# Patient Record
Sex: Male | Born: 1965 | Race: White | Hispanic: No | Marital: Married | Smoking: Former smoker
Health system: Southern US, Community
[De-identification: ages and names within clinical notes are randomized; demographics above are authoritative.]

## PROBLEM LIST (undated history)

## (undated) DIAGNOSIS — E785 Hyperlipidemia, unspecified: Secondary | ICD-10-CM

## (undated) DIAGNOSIS — IMO0001 Reserved for inherently not codable concepts without codable children: Secondary | ICD-10-CM

## (undated) DIAGNOSIS — I2510 Atherosclerotic heart disease of native coronary artery without angina pectoris: Secondary | ICD-10-CM

## (undated) DIAGNOSIS — R030 Elevated blood-pressure reading, without diagnosis of hypertension: Secondary | ICD-10-CM

## (undated) DIAGNOSIS — I10 Essential (primary) hypertension: Secondary | ICD-10-CM

## (undated) HISTORY — DX: Hyperlipidemia, unspecified: E78.5

## (undated) HISTORY — DX: Reserved for inherently not codable concepts without codable children: IMO0001

## (undated) HISTORY — DX: Elevated blood-pressure reading, without diagnosis of hypertension: R03.0

---

## 2010-04-06 HISTORY — PX: CORONARY STENT PLACEMENT: SHX1402

## 2010-05-14 ENCOUNTER — Encounter: Payer: Self-pay | Admitting: Cardiology

## 2010-05-21 ENCOUNTER — Telehealth: Payer: Self-pay | Admitting: Cardiology

## 2010-05-23 ENCOUNTER — Encounter: Payer: Self-pay | Admitting: Cardiology

## 2010-05-23 ENCOUNTER — Other Ambulatory Visit: Payer: Self-pay | Admitting: Cardiology

## 2010-05-23 ENCOUNTER — Ambulatory Visit (INDEPENDENT_AMBULATORY_CARE_PROVIDER_SITE_OTHER): Payer: Self-pay | Admitting: Cardiology

## 2010-05-23 DIAGNOSIS — E785 Hyperlipidemia, unspecified: Secondary | ICD-10-CM

## 2010-05-23 DIAGNOSIS — I10 Essential (primary) hypertension: Secondary | ICD-10-CM

## 2010-05-23 DIAGNOSIS — R079 Chest pain, unspecified: Secondary | ICD-10-CM

## 2010-05-23 LAB — BASIC METABOLIC PANEL
BUN: 16 mg/dL (ref 6–23)
GFR: 75.98 mL/min (ref >60.00)
Glucose, Bld: 82 mg/dL (ref 70–99)
Potassium: 4.1 mEq/L (ref 3.5–5.1)

## 2010-05-23 LAB — HEPATIC FUNCTION PANEL
AST: 24 U/L (ref 0–37)
Albumin: 4.4 g/dL (ref 3.5–5.2)

## 2010-05-23 LAB — LIPID PANEL
Cholesterol: 230 mg/dL — ABNORMAL HIGH (ref 0–200)
VLDL: 36.4 mg/dL (ref 0.0–40.0)

## 2010-05-26 ENCOUNTER — Ambulatory Visit: Payer: Self-pay | Admitting: Cardiovascular Disease

## 2010-05-28 NOTE — Assessment & Plan Note (Signed)
Summary: [facility]   Visit Type:  Initial Consult Primary Provider:  [facility]  CC:  chest pressure, sob, tightness, duration of the discomfort varies. left leg numbness, and cramping in buttocks while walking. [facility] very bad headaches..  History of Present Illness: Todd Kennedy comes with CC of CP from [facility]  It started about a year ago. It is over left breast and does not radiate. It occurs spontaneosly and not related to exertion. He tried PPI for several mos without relief.  He has multiple CRFs including age, 37 PY smoking, borderline HTN, and high cholesterol. Does not remember numbers.  Current Medications (verified): 1)  Multivitamins  Caps (Multiple Vitamin)  Allergies (verified): No Known Drug Allergies  Review of Systems       negative other than HPI  Vital Signs:  Patient profile:   45 year old male Height:      66 inches Weight:      161.75 pounds BMI:     26.20 Pulse rate:   58 / minute Resp:     18 per minute BP sitting:   130 / 84  (left arm) Cuff size:   large  Vitals Entered By: Celestia Khat, CMA (May 23, 2010 11:15 AM)  Physical Exam  General:  Well developed, well nourished, in no acute distress. Head:  normocephalic and atraumatic Eyes:  PERRLA/EOM intact; conjunctiva and lids normal. Neck:  Neck supple, no JVD. No masses, thyromegaly or abnormal cervical nodes. Chest Savana Spina:  no deformities or breast masses noted Lungs:  Clear bilaterally to auscultation and percussion. Heart:  PMI normal, NLS1 AND S2, NO MURMUR, NO CAROTID BRUITS Abdomen:  NL, NO BRUIT Msk:  Back normal, normal gait. Muscle strength and tone normal. Pulses:  pulses normal in all 4 extremities Extremities:  No clubbing or cyanosis. Neurologic:  Alert and oriented x 3. Skin:  Intact without lesions or rashes. Psych:  Normal affect.   EKG  Procedure date:  05/23/2010  Findings:      SINUS BRADY, NL  EKG  Impression & Recommendations:  Problem # 1:  CHEST PAIN UNSPECIFIED (ICD-786.50) Assessment New THIS IS NONCARDIAC. HOWEVER HE HAS MULTIPLE CRFS AND MOST LIKELY HAS SOME PLAQUE. WILL ASK HIM TO MONITOR BP, NO SMOKING, WILL CHECK LIPIDS TODAY AND MAY NEED STATIN, AND BEGIN ECASA 81MG  A DAY. COUNSELED ON SXS OF CAD AND HOW TO RESPOND APPROPRIATELY WITH HE AND WIFE. His updated medication list for this problem includes:    Aspirin 81 Mg Tbec (Aspirin) .Marland Kitchen... Take one tablet by mouth daily  Orders: EKG w/ Interpretation (93000) TLB-BMP (Basic Metabolic Panel-BMET) (80048-METABOL) TLB-Hepatic/Liver Function Pnl (80076-HEPATIC) TLB-Lipid Panel (80061-LIPID)  Patient Instructions: 1)  Your physician recommends that you schedule a follow-up appointment in: as needed with Dr. Daleen Squibb 2)  Your physician recommends that you have a FASTING lipid profile and cmp today.  3)  Your physician has recommended you make the following change in your medication: START  Aspirin 81mg  daily 4)  Your physician recommends a low cholesterol, low fat diet. Please see [facility] handout. 5)  Your physician has requested that you regularly monitor and record your blood pressure readings at home.  Please use the same machine at the same time of day to check your readings and record them to bring to your follow-up visit. GOAL BP less than 130/80 6)  Your physician discussed the importance of regular exercise and recommended that you start or  continue a regular exercise program for good health.

## 2010-05-28 NOTE — Progress Notes (Signed)
Summary: wants to be seen sooner  Phone Note Call from Patient Call back at Home Phone [phone]   Caller: Patient Reason for Call: Talk to Nurse Summary of Call: pt states he having more chest and sob more often. pt wants to know if he can be seen sooner.  Initial call taken by: Roe Coombs,  May 21, 2010 10:10 AM  Follow-up for Phone Call        spoke with pt, he is having increased symptoms of chest discomfort and after seeing the ortho was told he needed to see cardiology. appt moved to friday with dr wall. pt aware Deliah Goody, RN  May 21, 2010 10:58 AM

## 2010-05-29 ENCOUNTER — Telehealth: Payer: Self-pay | Admitting: Cardiology

## 2010-05-30 ENCOUNTER — Ambulatory Visit (INDEPENDENT_AMBULATORY_CARE_PROVIDER_SITE_OTHER): Payer: PRIVATE HEALTH INSURANCE | Admitting: Physician Assistant

## 2010-05-30 ENCOUNTER — Other Ambulatory Visit: Payer: Self-pay | Admitting: Physician Assistant

## 2010-05-30 ENCOUNTER — Encounter: Payer: Self-pay | Admitting: Physician Assistant

## 2010-05-30 DIAGNOSIS — R030 Elevated blood-pressure reading, without diagnosis of hypertension: Secondary | ICD-10-CM

## 2010-05-30 DIAGNOSIS — R002 Palpitations: Secondary | ICD-10-CM

## 2010-05-30 DIAGNOSIS — E785 Hyperlipidemia, unspecified: Secondary | ICD-10-CM | POA: Insufficient documentation

## 2010-05-30 LAB — URINALYSIS
Hgb urine dipstick: NEGATIVE
Specific Gravity, Urine: >=1.03 (ref 1.000–1.030)
Total Protein, Urine: NEGATIVE
Urine Glucose: NEGATIVE

## 2010-05-30 NOTE — Assessment & Plan Note (Signed)
He will pick up his prescription for Lipitor soon and start his medication.

## 2010-05-30 NOTE — Assessment & Plan Note (Signed)
Overall, I suspect he has pre-hypertension.  I have had a discussion with him today regarding therapeutic lifestyle modifications.  We will give him a handout regarding low salt diet.  Also recommend increase activity with exercise.  He has had some fluctuations in his blood pressure with associated flushing and palpitations.  Although I think the likelihood is very low, I will have him do a 24-hour urine for rule out of pheochromocytoma.  He had a normal creatinine and blood work recently.  We'll check a urinalysis to screen for protein.  His exam is negative for abdominal bruits.  He can follow up in 3 months routinely.  He will keep track of his blood pressures at home.  He knows to contact us sooner if his blood pressures remain elevated.

## 2010-05-30 NOTE — Progress Notes (Signed)
History of Present Illness: The patient is a 45 year old male recently seen by Dr. Daleen Squibb for chest pain.  This is felt to be noncardiac.  Lab work did demonstrate hyperlipidemia.  He was placed on atorvastatin.  He called in yesterday with concerns over his blood pressure.  His blood pressure was as high as 150/100.  He did feel flushed with this.  He's also noted some palpitations and diaphoresis in the past with elevated blood pressures.  This morning his blood pressure is normal.  He did have a high salt meal prior to his elevated blood pressure the other day.  He had coffee this morning.  We checked his wrist cuff with our manual blood pressure cuff and got similar readings.  When I repeated his blood pressure manually, his right arm was 120/80 and his left arm is 120/80.  He denies chest pain, shortness of breath, syncope.  He has had some headaches.  He denies difficulty with speech or unilateral weakness.  Of note, he has been on blood pressure medicines in the past.  He is adopted and does not know his family history.  He is an Corporate investment banker.  He recently stopped exertional activities until he saw Dr. Daleen Squibb last week.  Past Medical History  Diagnosis Date  . Hyperlipidemia     Statin therapy initiated 2012  . Elevated blood pressure     Previously took antihypertensives    Current Medications: Current outpatient prescriptions:aspirin 81 MG tablet, Take 81 mg by mouth daily.  , Disp: , Rfl: ;  Multiple Vitamin (MULTIVITAMIN) capsule, Take 1 capsule by mouth daily.  , Disp: , Rfl: ;  atorvastatin (LIPITOR) 10 MG tablet, Take 10 mg by mouth daily.  , Disp: , Rfl:   No Known Allergies  ROS:  See HPI.  All other systems reviewed and negative.  Vital Signs: BP 130/80  Pulse 90  Ht 5\' 6"  (1.676 m)  Wt 164 lb 8 oz (74.617 kg)  BMI 26.55 kg/m2  PHYSICAL EXAM: Well nourished, well developed, in no acute distress HEENT: normal Neck: no JVD Vasc: no carotid bruits Endo: no thyromegaly Cardiac:   normal S1, S2; RRR; no murmur Lungs:  clear to auscultation bilaterally, no wheezing, rhonchi or rales Abd: soft, nontender, no hepatomegaly Ext: no edema Skin: warm and dry Neuro:  CNs 2-12 intact, no focal abnormalities noted

## 2010-06-02 ENCOUNTER — Encounter: Payer: Self-pay | Admitting: Cardiology

## 2010-06-02 ENCOUNTER — Other Ambulatory Visit (INDEPENDENT_AMBULATORY_CARE_PROVIDER_SITE_OTHER): Payer: PRIVATE HEALTH INSURANCE

## 2010-06-02 DIAGNOSIS — R030 Elevated blood-pressure reading, without diagnosis of hypertension: Secondary | ICD-10-CM

## 2010-06-03 ENCOUNTER — Ambulatory Visit: Payer: Self-pay | Admitting: Cardiology

## 2010-06-03 ENCOUNTER — Other Ambulatory Visit: Payer: PRIVATE HEALTH INSURANCE

## 2010-06-03 ENCOUNTER — Encounter: Payer: Self-pay | Admitting: Cardiology

## 2010-06-03 NOTE — Miscellaneous (Signed)
Summary: Orders Update  Clinical Lists Changes  Orders: Added new Test order of TLB-Udip ONLY (81003-UDIP) - Signed 

## 2010-06-03 NOTE — Progress Notes (Signed)
Summary: blood pressure  Phone Note Call from Patient Call back at Home Phone [phone]   Reason for Call: Talk to Nurse Summary of Call: pt states his blood pressure today  153/107 147/102. pt states it been high for a couples of days. pt wants to know if he can get some meds. Initial call taken by: Roe Coombs,  May 29, 2010 12:02 PM  Follow-up for Phone Call        [facility] Follow-up by: Lisabeth Devoid RN,  May 29, 2010 2:03 PM  Additional Follow-up for Phone Call Additional follow up Details #1::        Pt calls with c/o blood pressure running high 05/28/10 153/107.  this was taken with his new bp cuff.  He has not been watching his sodium.  He felt lightheaded and a " little dizzy and weird" and flushed yesterday.   He also felt this way after eating tomato soup and crackers. He checked his bp and it was 150/102. He will see Tereso Newcomer PA 2/24 at 8:30 am regarding bp. Additional Follow-up by: Lisabeth Devoid RN,  May 29, 2010 2:36 PM     Appended Document: blood pressure    Clinical Lists Changes  Medications: Added new medication of ATORVASTATIN CALCIUM 10 MG TABS (ATORVASTATIN CALCIUM) 1 tablet at bedtime - Signed Rx of ATORVASTATIN CALCIUM 10 MG TABS (ATORVASTATIN CALCIUM) 1 tablet at bedtime;  #30 x 11;  Signed;  Entered by: Lisabeth Devoid RN;  Authorized by: Gaylord Shih, MD, [facility];  Method used: [facility]  [place] [address], [place], [place]  [address], Ph: [phone] or [phone], Fax: [phone]    Prescriptions: ATORVASTATIN CALCIUM 10 MG TABS (ATORVASTATIN CALCIUM) 1 tablet at bedtime  #30 x 11   Entered by:   Lisabeth Devoid RN   Authorized by:   Gaylord Shih, MD, [facility]   Signed by:   Lisabeth Devoid RN on 05/29/2010   Method used:   [facility]  [place] [address] [facility]       [address]       [place], [place]  [address]       Ph: [phone] or [phone]       Fax: [phone]   RxID:    [id number]    Appended Document: blood pressure  Reviewed Juanito Doom, MD

## 2010-06-03 NOTE — Assessment & Plan Note (Signed)
Summary: Please see noted done in [facility] that is scanned in.   Visit Type:  Follow-up Primary Provider:  [facility]  CC:  pt feels like blood pressure is high..  History of Present Illness:  Please see noted done in [facility] that is scanned in.  Current Medications (verified): 1)  Multivitamins  Caps (Multiple Vitamin) 2)  Aspirin 81 Mg Tbec (Aspirin) .... Take One Tablet By Mouth Daily 3)  Atorvastatin Calcium 10 Mg Tabs (Atorvastatin Calcium) .Marland Kitchen.. 1 Tablet At Bedtime  Allergies (verified): No Known Drug Allergies  Vital Signs:  Patient profile:   45 year old male Height:      66 inches Weight:      164.50 pounds BMI:     26.65 Pulse rate:   90 / minute Resp:     18 per minute BP sitting:   130 / 80  (left arm) Cuff size:   regular  Vitals Entered By: Celestia Khat, CMA (May 30, 2010 8:55 AM)   Other Orders: T-Culture, Urine ([id number])  Patient Instructions: 1)  Your physician wants you to follow-up in:   3 months with Tereso Newcomer, PA-C. You will receive a reminder letter in the mail two months in advance. If you don't receive a letter, please call our office to schedule the follow-up appointment. 2)  Your physician has requested that you limit the intake of sodium (salt) in your diet to four grams daily. Please see [facility] handout. 3)  Your physician recommends that you return for lab work in: Today for a Urinalysis for 796.2, 785.1.  4)  You will be given equipment to provide [place] with a 24 hour urine. 5)  Your physician has requested that you regularly monitor and record your blood pressure readings at home.  Please use the same machine at the same time of day to check your readings and record them to bring to your follow-up visit.  Appended Document: [facility]     Allergies: No Known Drug Allergies

## 2010-06-04 ENCOUNTER — Emergency Department (HOSPITAL_COMMUNITY)
Admission: EM | Admit: 2010-06-04 | Discharge: 2010-06-04 | Disposition: A | Discharge status: Home / Self Care | Payer: PRIVATE HEALTH INSURANCE | Attending: Emergency Medicine | Admitting: Emergency Medicine

## 2010-06-04 ENCOUNTER — Emergency Department (HOSPITAL_COMMUNITY): Payer: PRIVATE HEALTH INSURANCE

## 2010-06-04 ENCOUNTER — Telehealth: Payer: Self-pay | Admitting: Cardiology

## 2010-06-04 DIAGNOSIS — R079 Chest pain, unspecified: Secondary | ICD-10-CM | POA: Insufficient documentation

## 2010-06-04 DIAGNOSIS — R0602 Shortness of breath: Secondary | ICD-10-CM | POA: Insufficient documentation

## 2010-06-04 DIAGNOSIS — E789 Disorder of lipoprotein metabolism, unspecified: Secondary | ICD-10-CM | POA: Insufficient documentation

## 2010-06-04 DIAGNOSIS — Z79899 Other long term (current) drug therapy: Secondary | ICD-10-CM | POA: Insufficient documentation

## 2010-06-04 LAB — CBC
HCT: 45.2 % (ref 39.0–52.0)
MCH: 31.1 pg (ref 26.0–34.0)
MCHC: 36.3 g/dL — ABNORMAL HIGH (ref 30.0–36.0)
MCV: 85.6 fL (ref 78.0–100.0)
RDW: 12.4 % (ref 11.5–15.5)

## 2010-06-04 LAB — COMPREHENSIVE METABOLIC PANEL
ALT: 78 U/L — ABNORMAL HIGH (ref 0–53)
Alkaline Phosphatase: 49 U/L (ref 39–117)
CO2: 29 mEq/L (ref 19–32)
Chloride: 102 mEq/L (ref 96–112)
Glucose, Bld: 95 mg/dL (ref 70–99)
Potassium: 4 mEq/L (ref 3.5–5.1)
Sodium: 138 mEq/L (ref 135–145)
Total Protein: 7.1 g/dL (ref 6.0–8.3)

## 2010-06-04 LAB — POCT CARDIAC MARKERS: CKMB, poc: <1 ng/mL — ABNORMAL LOW (ref 1.0–8.0)

## 2010-06-10 ENCOUNTER — Encounter (INDEPENDENT_AMBULATORY_CARE_PROVIDER_SITE_OTHER): Payer: PRIVATE HEALTH INSURANCE | Admitting: Physician Assistant

## 2010-06-10 ENCOUNTER — Encounter: Payer: Self-pay | Admitting: Physician Assistant

## 2010-06-10 DIAGNOSIS — R079 Chest pain, unspecified: Secondary | ICD-10-CM

## 2010-06-12 NOTE — Progress Notes (Signed)
Summary: pt req call from nurse  Phone Note Call from Patient   Caller: Patient [phone] Reason for Call: Talk to Nurse, Talk to Doctor Summary of Call: pt would like call from nurse.Lauris Chroman had chest pain today and he wants to talk to you and Dr. Daleen Squibb about it. Initial call taken by: Glynda Jaeger,  June 04, 2010 1:55 PM  Follow-up for Phone Call        Spoke with pt who reports he had chest pain yesterday that happened several times and lasted around 20 seconds each time. Pain went away on own. Today has had constant pain in chest area. Pt states pain is worse when breathing in. He is able to work today but having a very stressful day due to it being the last day of the month.  He is wondering what the treatment plan is for his continuing chest pain.  Office note from visit with Dr. Daleen Squibb indicates non cardiac.  Will review with Tereso Newcomer, PA who saw pt last week.  Follow-up by: Dossie Arbour, RN, BSN,  June 04, 2010 4:40 PM  Additional Follow-up for Phone Call Additional follow up Details #1::        Discussed with Tereso Newcomer, PA who recommends treadmill if pain same as previous pain.  If pain increasing or different he should be seen in ED. Pt given this information and he would like to proceed with treadmill.  Instructions given for treadmill and pt transferred to schedulers to arrange appt. Additional Follow-up by: Dossie Arbour, RN, BSN,  June 04, 2010 5:00 PM

## 2010-06-17 NOTE — Letter (Signed)
Summary: Referral from [facility] @ [facility]  Referral from [place] @ [facility]   Imported By: Kassie Mends 06/11/2010 10:21:29  _____________________________________________________________________  External Attachment:    Type:   Image     Comment:   External Document

## 2010-06-17 NOTE — Progress Notes (Signed)
Summary: Office Visit  Office Visit   Imported By: Earl Many 06/10/2010 10:34:27  _____________________________________________________________________  External Attachment:    Type:   Image     Comment:   External Document

## 2010-06-18 LAB — CONVERTED CEMR LAB
Metaneph Total, Ur: 585 ug/24hr (ref 182–739)
Metanephrines, Ur: 172 (ref 58–203)
Normetanephrine, 24H Ur: 413 (ref 88–649)

## 2010-06-27 ENCOUNTER — Other Ambulatory Visit: Payer: PRIVATE HEALTH INSURANCE

## 2011-01-01 ENCOUNTER — Other Ambulatory Visit: Payer: Self-pay | Admitting: Family Medicine

## 2011-01-20 ENCOUNTER — Ambulatory Visit
Admission: RE | Admit: 2011-01-20 | Discharge: 2011-01-20 | Disposition: A | Discharge status: Home / Self Care | Payer: BC Managed Care – PPO | Source: Ambulatory Visit | Attending: Family Medicine | Admitting: Family Medicine

## 2012-02-22 IMAGING — CR DG CHEST 2V
2 series · 2 of 2 positions shown · non-contrast
Comparison: None.

CLINICAL DATA: Chest pain

CHEST - 2 VIEW

[w chest pa]
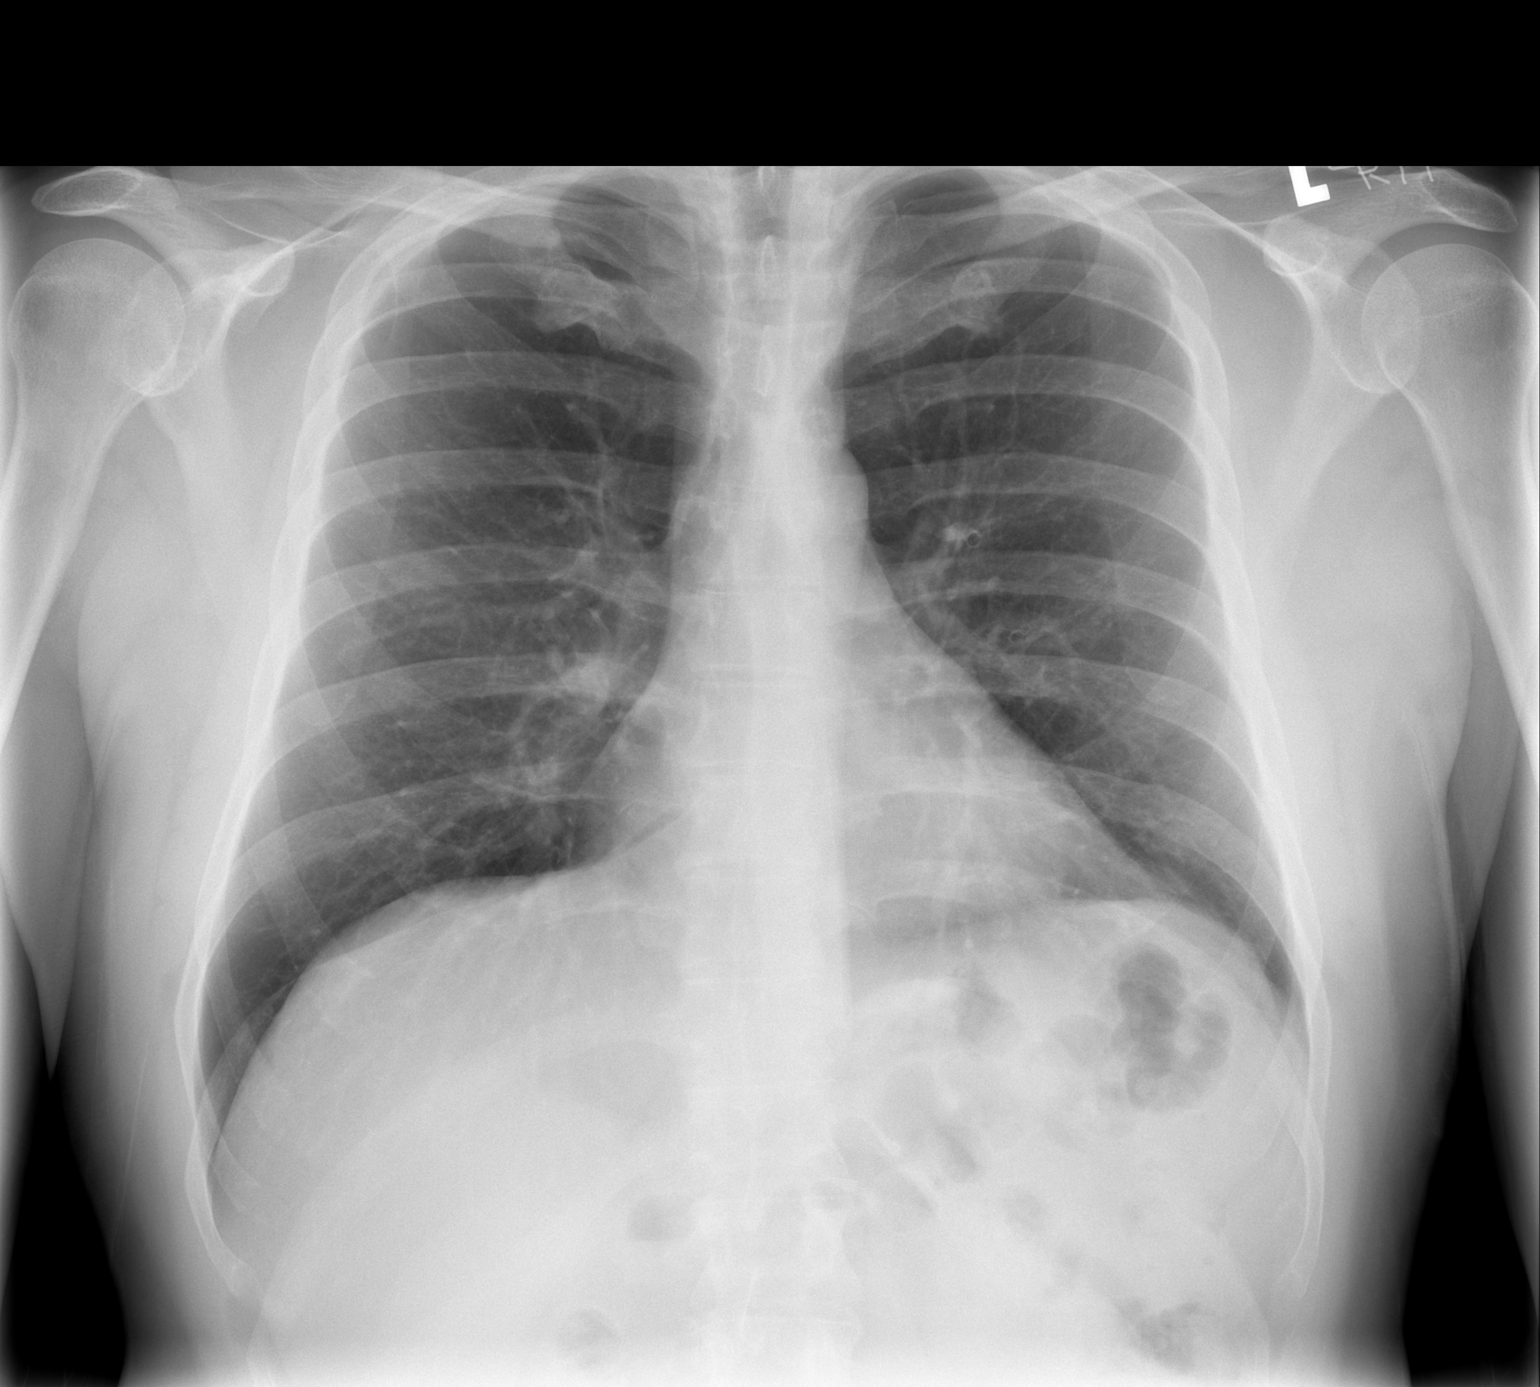

[w chest lat]
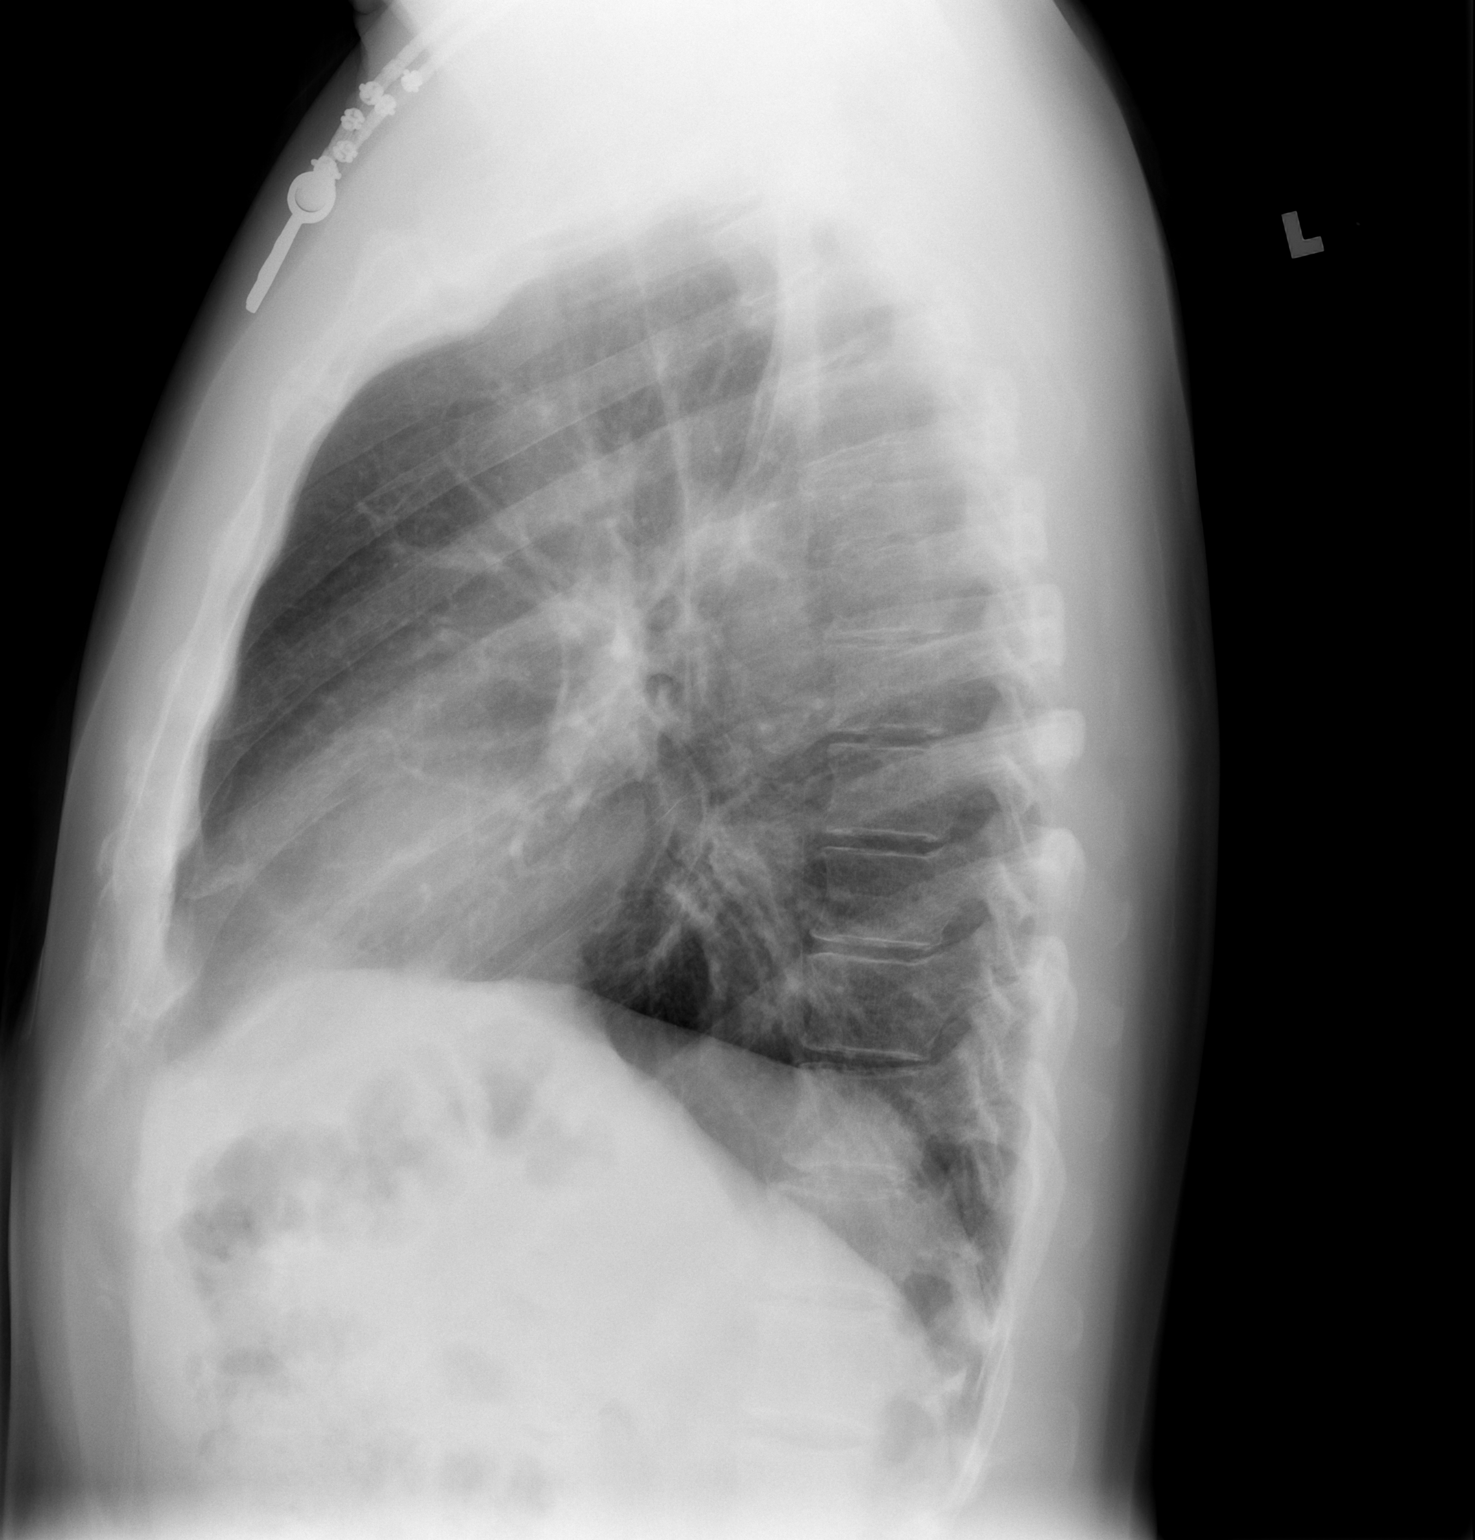

[2 of 2 positions shown; findings below may reference images not displayed]

FINDINGS: Heart is also is within normal limits.  Mild bronchitic
changes.  No pneumothorax and no pleural effusion.  No
consolidation or mass.
IMPRESSION: Bronchitic changes.

## 2012-10-09 IMAGING — RF DG ESOPHAGUS
14 of 17 series · 19 of 24 positions shown · non-contrast
Comparison: None.

CLINICAL DATA: Chest pain

ESOPHOGRAM/BARIUM SWALLOW
TECHNIQUE: Combined double contrast and single contrast
examination performed using effervescent crystals, thick barium
liquid, and thin barium liquid.
Fluoroscopy time:  1.7 minutes.

[Series 2: run · 1 of 1 slices shown (1 of 14)]
[im 1/1]
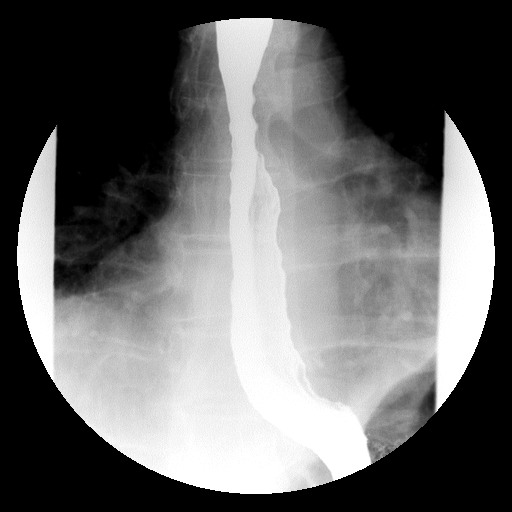

[Series 3: run · 1 of 1 slices shown (2 of 14)]
[im 1/1]
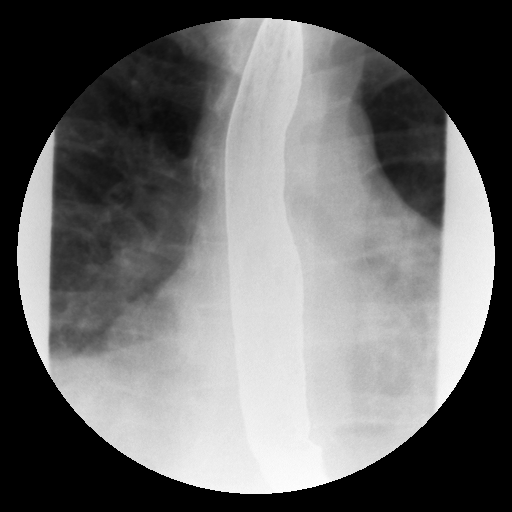

[Series 5: run · 1 of 1 slices shown (3 of 14)]
[im 1/1]
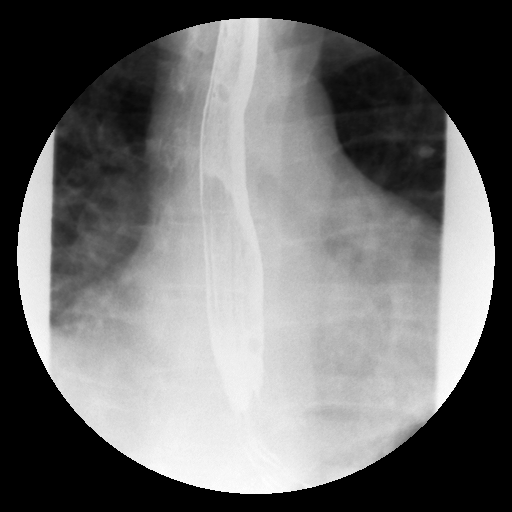

[Series 6: run · 1 of 1 slices shown (4 of 14)]
[im 1/1]
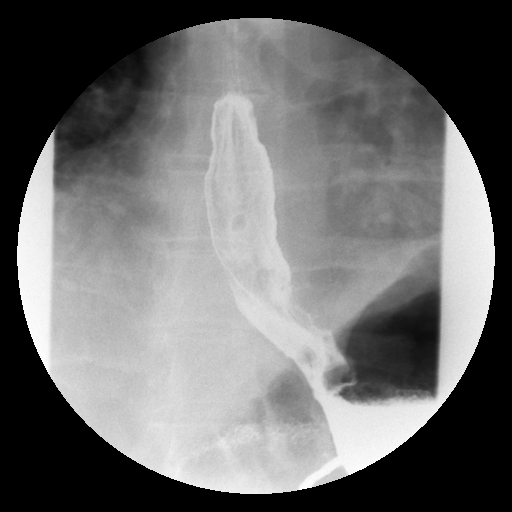

[Series 7: run · 4 of 13 slices shown (5 of 14)]
[im 1/13]
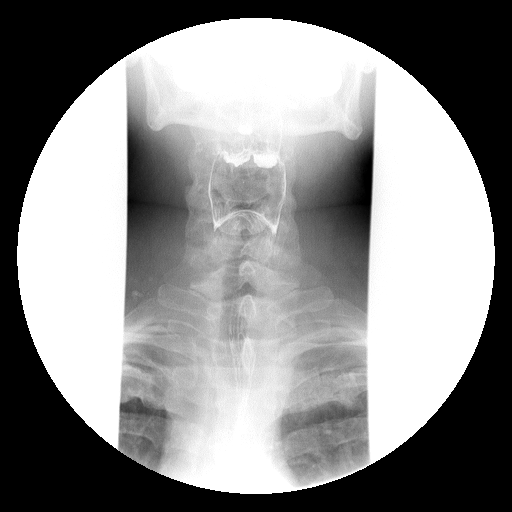
[im 4/13]
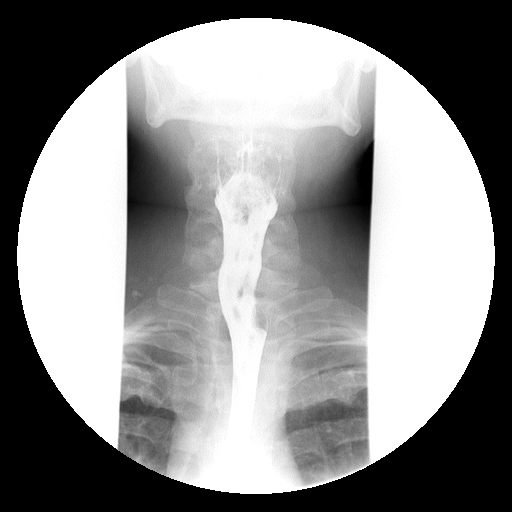
[im 10/13]
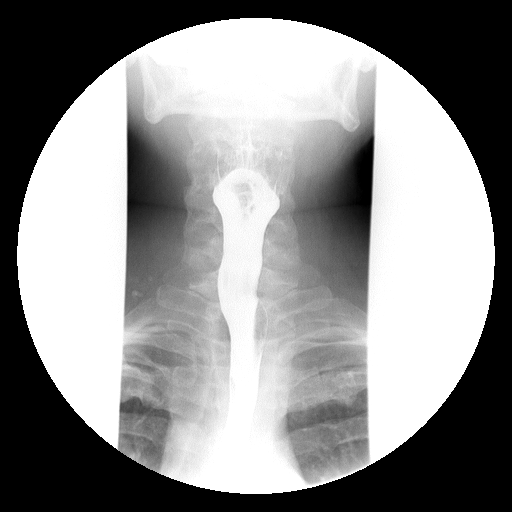
[im 13/13]
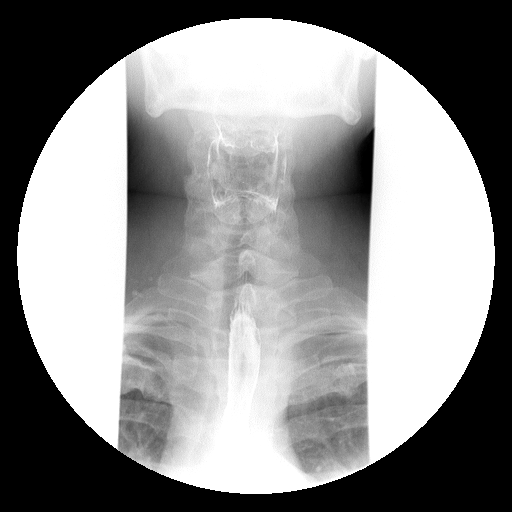

[Series 8: run · 3 of 12 slices shown (6 of 14)]
[im 1/12]
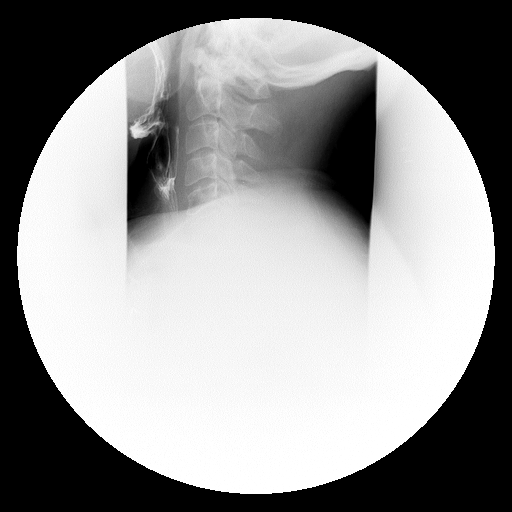
[im 8/12]
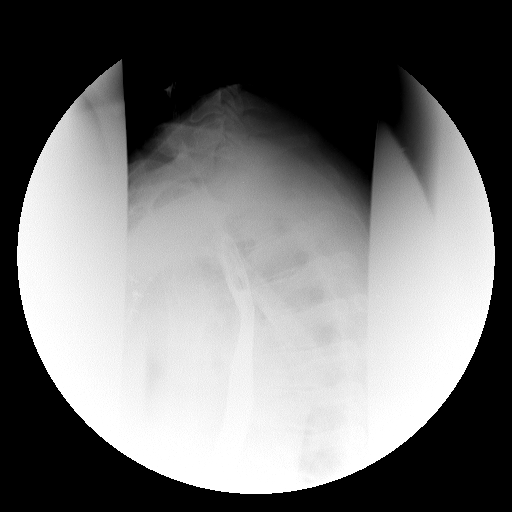
[im 12/12]
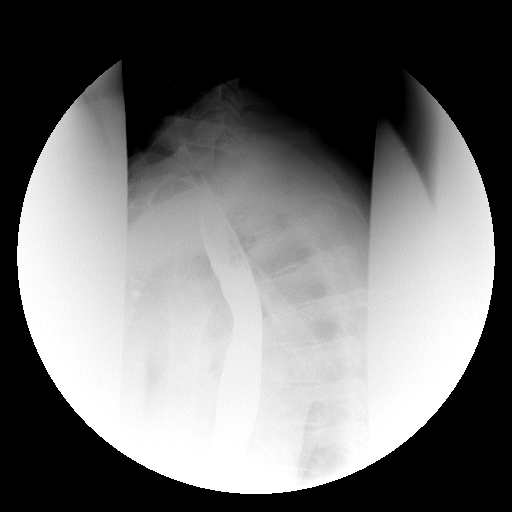

[Series 9: run · 1 of 1 slices shown (7 of 14)]
[im 1/1]
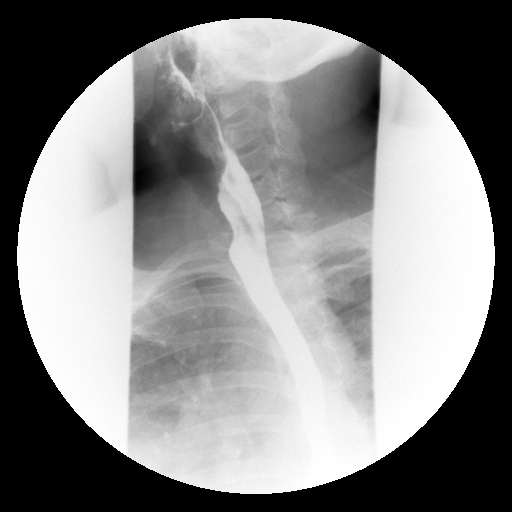

[Series 10: run · 1 of 1 slices shown (8 of 14)]
[im 1/1]
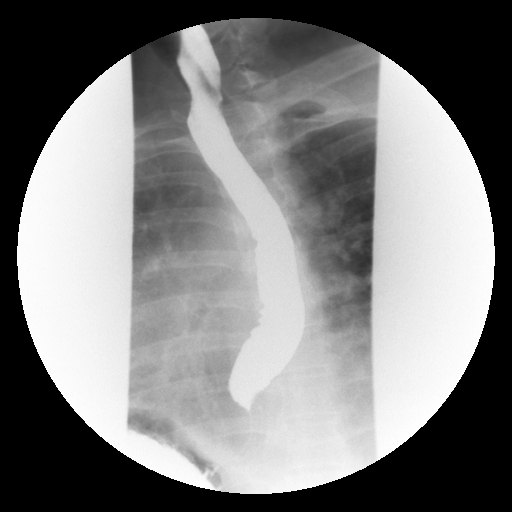

[Series 12: run · 1 of 1 slices shown (9 of 14)]
[im 1/1]
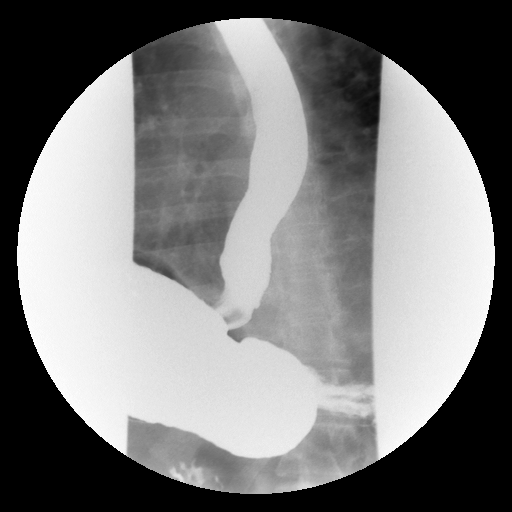

[Series 13: run · 1 of 1 slices shown (10 of 14)]
[im 1/1]
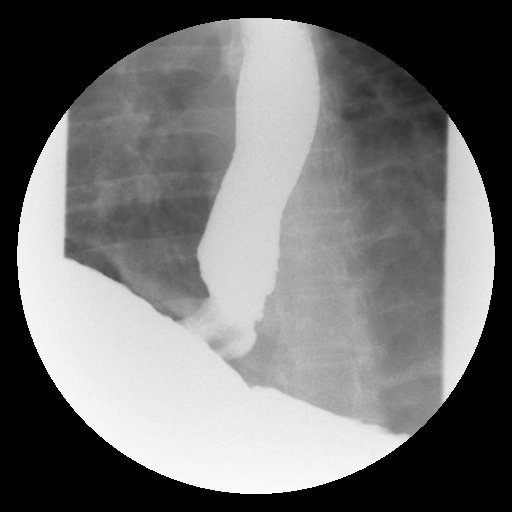

[Series 14: run · 1 of 1 slices shown (11 of 14)]
[im 1/1]
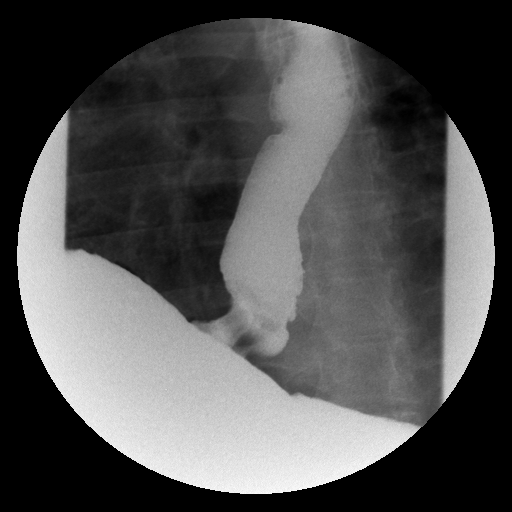

[Series 15: run · 1 of 1 slices shown (12 of 14)]
[im 1/1]
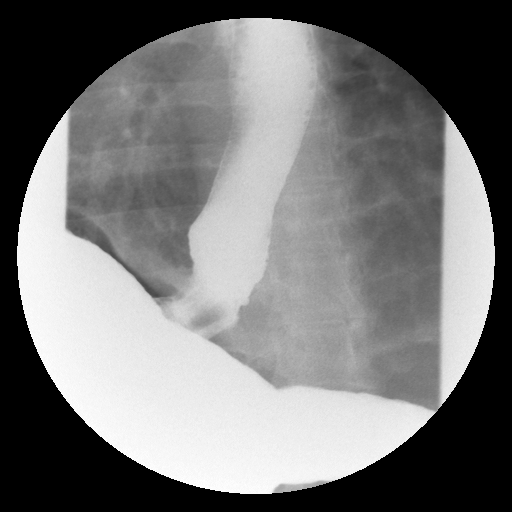

[Series 17: run · 1 of 1 slices shown (13 of 14)]
[im 1/1]
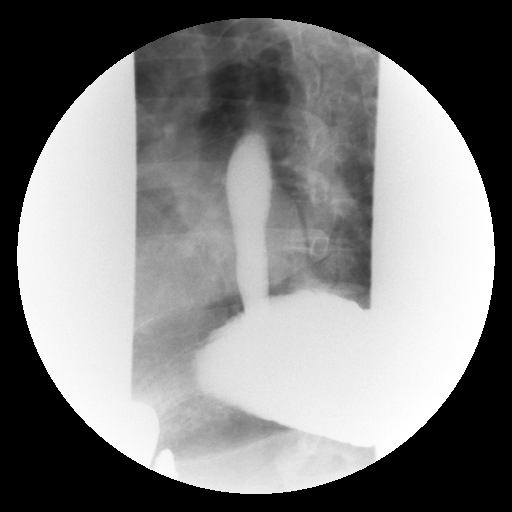

[Series 18: run · 1 of 1 slices shown (14 of 14)]
[im 1/1]
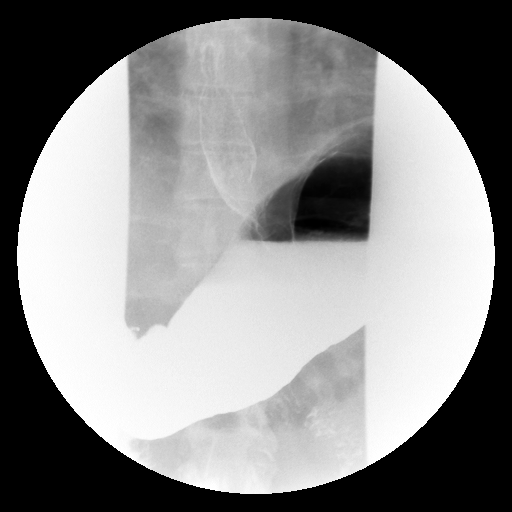

[19 of 24 positions shown; findings below may reference images not displayed]

FINDINGS: Initially a double contrast study was performed.  The
mucosa of the esophagus is unremarkable.  There are mild tertiary
contractions in the mid and distal esophagus.  Rapid sequence spot
films show the swallowing mechanism to be unremarkable.  Esophageal
peristalsis is within normal limits with the exception of very
minimal tertiary contractions distally.  There is however a small
hiatal hernia present.  Also there is some irregularity noted of
the mucosa of the distal esophagus which may indicate esophagitis.
Moderate gastroesophageal reflux is demonstrated.  A barium pill
was given at the end of the study which did pass into the stomach
without significant delay.
IMPRESSION: 1.  Small hiatal hernia with moderate reflux.
2.  Question esophagitis of the distal esophagus.
3.  Barium pill passes into the stomach without delay.

## 2013-06-22 ENCOUNTER — Encounter: Payer: Self-pay | Admitting: Cardiology

## 2015-04-07 DIAGNOSIS — J45909 Unspecified asthma, uncomplicated: Secondary | ICD-10-CM

## 2015-04-07 HISTORY — DX: Unspecified asthma, uncomplicated: J45.909

## 2017-04-06 HISTORY — PX: THYROIDECTOMY, PARTIAL: SHX18

## 2020-10-04 ENCOUNTER — Other Ambulatory Visit: Payer: Self-pay | Admitting: Otolaryngology

## 2020-10-04 NOTE — Pre-Procedure Instructions (Signed)
Surgical Instructions    Your procedure is scheduled on Friday July 8th.  Report to [facility] Main Entrance "A" at 06:30 A.M., then check in with the Admitting office.  Call this number if you have problems the morning of surgery:  [phone]   If you have any questions prior to your surgery date call [phone]: Open Monday-Friday 8am-4pm    Remember:  Do not eat or drink after midnight the night before your surgery     Take these medicines the morning of surgery with A SIP OF WATER   albuterol (VENTOLIN HFA)- If needed  atenolol (TENORMIN)   As of today, STOP taking any Aspirin (unless otherwise instructed by your surgeon) Aleve, Naproxen, Ibuprofen, Motrin, Advil, Goody's, BC's, all herbal medications, fish oil, and all vitamins.                     Do NOT Smoke (Tobacco/Vaping) or drink Alcohol 24 hours prior to your procedure.  If you use a CPAP at night, you may bring all equipment for your overnight stay.   Contacts, glasses, piercing's, hearing aid's, dentures or partials may not be worn into surgery, please bring cases for these belongings.    For patients admitted to the hospital, discharge time will be determined by your treatment team.   Patients discharged the day of surgery will not be allowed to drive home, and someone needs to stay with them for 24 hours.  ONLY 1 SUPPORT PERSON MAY BE PRESENT WHILE YOU ARE IN SURGERY. IF YOU ARE TO BE ADMITTED ONCE YOU ARE IN YOUR ROOM YOU WILL BE ALLOWED TWO (2) VISITORS.  Minor children may have two parents present. Special consideration for safety and communication needs will be reviewed on a case by case basis.   Special instructions:   [facility]- Preparing For Surgery  Before surgery, you can play an important role. Because skin is not sterile, your skin needs to be as free of germs as possible. You can reduce the number of germs on your skin by washing with CHG (chlorahexidine gluconate) Soap before surgery.  CHG is  an antiseptic cleaner which kills germs and bonds with the skin to continue killing germs even after washing.    Oral Hygiene is also important to reduce your risk of infection.  Remember - BRUSH YOUR TEETH THE MORNING OF SURGERY WITH YOUR REGULAR TOOTHPASTE  Please do not use if you have an allergy to CHG or antibacterial soaps. If your skin becomes reddened/irritated stop using the CHG.  Do not shave (including legs and underarms) for at least 48 hours prior to first CHG shower. It is OK to shave your face.  Please follow these instructions carefully.   Shower the NIGHT BEFORE SURGERY and the MORNING OF SURGERY  If you chose to wash your hair, wash your hair first as usual with your normal shampoo.  After you shampoo, rinse your hair and body thoroughly to remove the shampoo.  Use CHG Soap as you would any other liquid soap. You can apply CHG directly to the skin and wash gently with a scrungie or a clean washcloth.   Apply the CHG Soap to your body ONLY FROM THE NECK DOWN.  Do not use on open wounds or open sores. Avoid contact with your eyes, ears, mouth and genitals (private parts). Wash Face and genitals (private parts)  with your normal soap.   Wash thoroughly, paying special attention to the area where your surgery will be  performed.  Thoroughly rinse your body with warm water from the neck down.  DO NOT shower/wash with your normal soap after using and rinsing off the CHG Soap.  Pat yourself dry with a CLEAN TOWEL.  Wear CLEAN PAJAMAS to bed the night before surgery  Place CLEAN SHEETS on your bed the night before your surgery  DO NOT SLEEP WITH PETS.   Day of Surgery: Shower with CHG soap. Do not wear jewelry, make up, nail polish, gel polish, artificial nails, or any other type of covering on natural nails including finger and toenails. If patients have artificial nails, gel coating, etc. that need to be removed by a nail salon please have this removed prior to  surgery. Surgery may need to be canceled/delayed if the surgeon/ anesthesia feels like the patient is unable to be adequately monitored. Do not wear lotions, powders, perfumes/colognes, or deodorant. Do not shave 48 hours prior to surgery.  Men may shave face and neck. Do not bring valuables to the hospital. [facility] is not responsible for any belongings or valuables. Wear Clean/Comfortable clothing the morning of surgery Remember to brush your teeth WITH YOUR REGULAR TOOTHPASTE.   Please read over the following fact sheets that you were given.

## 2020-10-08 ENCOUNTER — Encounter (HOSPITAL_COMMUNITY)
Admission: RE | Admit: 2020-10-08 | Discharge: 2020-10-08 | Disposition: A | Discharge status: Home / Self Care | Payer: Managed Care, Other (non HMO) | Source: Ambulatory Visit | Attending: Otolaryngology | Admitting: Otolaryngology

## 2020-10-08 ENCOUNTER — Encounter (HOSPITAL_COMMUNITY): Payer: Self-pay

## 2020-10-08 ENCOUNTER — Other Ambulatory Visit: Payer: Self-pay

## 2020-10-08 DIAGNOSIS — Z01812 Encounter for preprocedural laboratory examination: Secondary | ICD-10-CM | POA: Insufficient documentation

## 2020-10-08 HISTORY — DX: Atherosclerotic heart disease of native coronary artery without angina pectoris: I25.10

## 2020-10-08 HISTORY — DX: Essential (primary) hypertension: I10

## 2020-10-08 LAB — BASIC METABOLIC PANEL
Anion gap: 8 (ref 5–15)
BUN: 18 mg/dL (ref 6–20)
CO2: 27 mmol/L (ref 22–32)
Calcium: 8.9 mg/dL (ref 8.9–10.3)
Chloride: 104 mmol/L (ref 98–111)
Creatinine, Ser: 1.08 mg/dL (ref 0.61–1.24)
GFR, Estimated: >60 mL/min (ref >60)
Glucose, Bld: 104 mg/dL — ABNORMAL HIGH (ref 70–99)
Potassium: 4 mmol/L (ref 3.5–5.1)
Sodium: 139 mmol/L (ref 135–145)

## 2020-10-08 NOTE — Progress Notes (Addendum)
PCP - Mitzi Hansen, NP- [facility] at [facility] Cardiologist - Foster Simpson at [facility]- per patient has not seen since 2013  PPM/ICD - n/a Device Orders - n/a Rep Notified - n/a  Chest x-ray - denies EKG - 09/20/20 at [facility]- Requested from office Stress Test - 06/10/2010 ECHO - denies Cardiac Cath - denies  Sleep Study - denies CPAP - denies  Fasting Blood Sugar - n/a Checks Blood Sugar- n/a  Blood Thinner Instructions: n/a Aspirin Instructions: As of today stop taking any Aspirin unless otherwise instructed by your surgeon.   NPO after midnight  COVID TEST- n/a. Ambulatory Surgery   Anesthesia review: Yes. Recent lab work and EKG from 6/17 requested from Mitzi Hansen at [facility] at [facility].  Patient denies shortness of breath, fever, cough and chest pain at PAT appointment   All instructions explained to the patient, with a verbal understanding of the material. Patient agrees to go over the instructions while at home for a better understanding. Patient also instructed to self quarantine after being tested for COVID-19. The opportunity to ask questions was provided.

## 2020-10-09 ENCOUNTER — Encounter (HOSPITAL_COMMUNITY): Payer: Self-pay

## 2020-10-09 NOTE — Progress Notes (Signed)
Anesthesia Chart Review:  Hx of single vessel CAD by Cath 2013, treated with DES to LAD. Last seen by cardiology at [facility] 02/02/2018 for preop visit prior to Left hemithyroidectomy. Per clearance note, "--55 year old male with history of coronary artery disease and LAD stenting 2013, hypertension and dyslipidemia has been diagnosed with a left thyroid nodule. He presents for preoperative risk stratification for upcoming thyroidectomy. -EKG shows no acute abnormality. Treadmill stress test performed 04/12/2017 shows no evidence of ischemia. - From a cardiology perspective, the patient is an acceptable risk for upcoming thyroidectomy. He may hold his aspirin as indicated by the surgeon."  Pt reports he has not followed with cardiology since that time. Chronic medical conditions followed by PCP Mitzi Hansen, FNP. Pt was last seen 09/20/20 for preop clearance for ENT surgery. EKG at that time show sinus rhythm, no ST or T wave abnormalities. CBC (09/20/20) and CMP (09/05/20) notable only for mildly elevated WBC 13.2, pt reported being on antibiotic at that time for dental infection. Copy of labs on chart.   Pt reports he continues to maintain high activity level. Exercises 3x per week 20-30 minutes of circuit training on a Bowflex machine. Denies any SOB or CP.   BMP 10/08/20 reviewed, unremarkable.  EKG 09/20/20 (copy on chart): Sinus rhythm. Rate 63. Poor R wave progression, nonspecific, consider old anterior infarct.   Treadmill exercise test 04/12/2017 (care everywhere): Exercise Time Achieved: 11 min: 00 sec.    METs Achieved: 12.8  Functional aerobic impairment:  None.  Test Stopped: Mild Dyspnea and Fatigue, No Chest Discomfort.  Dysrhythmias: None.  ST Segment Baseline: -  ST Segment Stress Changes: None.   Treadmill Conclusion:  1) Negative Graded Exercise Treadmill Test.  2) Normal Functional Capacity.   PCI 04/08/11 (care everywhere): CONCLUSIONS:   CORONARY STATUS: Obstructive 1 vessel    OTHER:   LM nl  LAD mid 99%  LCX nl  RCA nl  PCI prox LAD to 0% with 2.5x18 DES      Antionette Poles, Cordelia [employer] [facility] Short Stay Center/Anesthesiology Phone [phone] 10/09/2020 1:28 PM

## 2020-10-09 NOTE — Anesthesia Preprocedure Evaluation (Addendum)
Anesthesia Evaluation  Patient identified by MRN, date of birth, ID band Patient awake    Reviewed: Allergy & Precautions, NPO status , Patient's Chart, lab work & pertinent test results  History of Anesthesia Complications Negative for: history of anesthetic complications  Airway Mallampati: I  TM Distance: >3 FB Neck ROM: Full    Dental no notable dental hx. (+) Dental Advisory Given   Pulmonary asthma , Patient abstained from smoking., former smoker,    Pulmonary exam normal breath sounds clear to auscultation       Cardiovascular hypertension, Pt. on home beta blockers + CAD and + Cardiac Stents  Normal cardiovascular exam Rhythm:Regular Rate:Normal  Treadmill stress test performed 04/12/2017 shows no evidence of ischemia. - From a cardiology perspective, the patient is an acceptable risk for upcoming thyroidectomy. He may hold his aspirin as indicated by the surgeon."   Neuro/Psych negative neurological ROS     GI/Hepatic negative GI ROS, Neg liver ROS,   Endo/Other  negative endocrine ROS  Renal/GU negative Renal ROS     Musculoskeletal negative musculoskeletal ROS (+)   Abdominal   Peds  Hematology negative hematology ROS (+)   Anesthesia Other Findings   Reproductive/Obstetrics                         Anesthesia Physical Anesthesia Plan  ASA: 3  Anesthesia Plan: General   Post-op Pain Management:    Induction: Intravenous  PONV Risk Score and Plan: 4 or greater and Ondansetron, Dexamethasone, Midazolam, Scopolamine patch - Pre-op and Treatment may vary due to age or medical condition  Airway Management Planned: Oral ETT  Additional Equipment: None  Intra-op Plan:   Post-operative Plan: Extubation in OR  Informed Consent: I have reviewed the patients History and Physical, chart, labs and discussed the procedure including the risks, benefits and alternatives for the proposed  anesthesia with the patient or authorized representative who has indicated his/her understanding and acceptance.     Dental advisory given  Plan Discussed with: CRNA  Anesthesia Plan Comments: (PAT note by Antionette Poles, PA-C: Hx of single vessel CAD by Cath 2013, treated with DES to LAD. Last seen by cardiology at [facility] 02/02/2018 for preop visit prior to Left hemithyroidectomy. Per clearance note, "--56 year old male with history of coronary artery disease and LAD stenting 2013, hypertension and dyslipidemia has been diagnosed with a left thyroid nodule. He presents for preoperative risk stratification for upcoming thyroidectomy. -EKG shows no acute abnormality. Treadmill stress test performed 04/12/2017 shows no evidence of ischemia. - From a cardiology perspective, the patient is an acceptable risk for upcoming thyroidectomy. He may hold his aspirin as indicated by the surgeon."  Pt reports he has not followed with cardiology since that time. Chronic medical conditions followed by PCP Mitzi Hansen, FNP. Pt was last seen 09/20/20 for preop clearance for ENT surgery. EKG at that time show sinus rhythm, no ST or T wave abnormalities. CBC (09/20/20) and CMP (09/05/20) notable only for mildly elevated WBC 13.2, pt reported being on antibiotic at that time for dental infection. Copy of labs on chart.   Pt reports he continues to maintain high activity level. Exercises 3x per week 20-30 minutes of circuit training on a Bowflex machine. Denies any SOB or CP.   BMP 10/08/20 reviewed, unremarkable.  EKG 09/20/20 (copy on chart): Sinus rhythm. Rate 63. Poor R wave progression, nonspecific, consider old anterior infarct.   Treadmill exercise test 04/12/2017 (care everywhere): Exercise Time  Achieved: 11 min: 00 sec.  METs Achieved: 12.8  Functional aerobic impairment: None.  Test Stopped: Mild Dyspnea and Fatigue, No Chest Discomfort.  Dysrhythmias: None.  ST Segment Baseline: -  ST Segment Stress Changes:  None.   Treadmill Conclusion:  1) Negative Graded Exercise Treadmill Test.  2) Normal Functional Capacity.   PCI 04/08/11 (care everywhere): CONCLUSIONS:   CORONARY STATUS: Obstructive 1 vessel   OTHER:  LM nl  LAD mid 99%  LCX nl  RCA nl  PCI prox LAD to 0% with 2.5x18 DES   )     Anesthesia Quick Evaluation

## 2020-10-11 ENCOUNTER — Ambulatory Visit (HOSPITAL_COMMUNITY): Payer: Managed Care, Other (non HMO) | Admitting: Physician Assistant

## 2020-10-11 ENCOUNTER — Encounter (HOSPITAL_COMMUNITY): Payer: Self-pay | Admitting: Otolaryngology

## 2020-10-11 ENCOUNTER — Ambulatory Visit (HOSPITAL_COMMUNITY): Payer: Managed Care, Other (non HMO) | Admitting: Anesthesiology

## 2020-10-11 ENCOUNTER — Encounter (HOSPITAL_COMMUNITY)
Admission: RE | Disposition: A | Discharge status: Home / Self Care | Payer: Self-pay | Source: Home / Self Care | Attending: Otolaryngology

## 2020-10-11 ENCOUNTER — Other Ambulatory Visit: Payer: Self-pay

## 2020-10-11 ENCOUNTER — Observation Stay (HOSPITAL_COMMUNITY)
Admission: RE | Admit: 2020-10-11 | Discharge: 2020-10-12 | Disposition: A | Discharge status: Home / Self Care | Payer: Managed Care, Other (non HMO) | Attending: Otolaryngology | Admitting: Otolaryngology

## 2020-10-11 DIAGNOSIS — G9608 Other cranial cerebrospinal fluid leak: Secondary | ICD-10-CM | POA: Diagnosis not present

## 2020-10-11 DIAGNOSIS — J342 Deviated nasal septum: Principal | ICD-10-CM | POA: Insufficient documentation

## 2020-10-11 DIAGNOSIS — J45909 Unspecified asthma, uncomplicated: Secondary | ICD-10-CM | POA: Insufficient documentation

## 2020-10-11 DIAGNOSIS — J329 Chronic sinusitis, unspecified: Secondary | ICD-10-CM | POA: Diagnosis not present

## 2020-10-11 DIAGNOSIS — Y658 Other specified misadventures during surgical and medical care: Secondary | ICD-10-CM | POA: Insufficient documentation

## 2020-10-11 DIAGNOSIS — G9600 Cerebrospinal fluid leak, unspecified: Secondary | ICD-10-CM | POA: Insufficient documentation

## 2020-10-11 DIAGNOSIS — I2510 Atherosclerotic heart disease of native coronary artery without angina pectoris: Secondary | ICD-10-CM | POA: Insufficient documentation

## 2020-10-11 DIAGNOSIS — Z955 Presence of coronary angioplasty implant and graft: Secondary | ICD-10-CM | POA: Diagnosis not present

## 2020-10-11 DIAGNOSIS — Z7982 Long term (current) use of aspirin: Secondary | ICD-10-CM | POA: Insufficient documentation

## 2020-10-11 DIAGNOSIS — J339 Nasal polyp, unspecified: Secondary | ICD-10-CM | POA: Insufficient documentation

## 2020-10-11 DIAGNOSIS — J338 Other polyp of sinus: Secondary | ICD-10-CM | POA: Insufficient documentation

## 2020-10-11 DIAGNOSIS — J343 Hypertrophy of nasal turbinates: Secondary | ICD-10-CM | POA: Insufficient documentation

## 2020-10-11 DIAGNOSIS — Z79899 Other long term (current) drug therapy: Secondary | ICD-10-CM | POA: Diagnosis not present

## 2020-10-11 DIAGNOSIS — I10 Essential (primary) hypertension: Secondary | ICD-10-CM | POA: Diagnosis not present

## 2020-10-11 DIAGNOSIS — Z87891 Personal history of nicotine dependence: Secondary | ICD-10-CM | POA: Insufficient documentation

## 2020-10-11 HISTORY — PX: SPHENOIDECTOMY: SHX2421

## 2020-10-11 HISTORY — PX: BALLOON SINUPLASTY: SHX5740

## 2020-10-11 HISTORY — PX: ENDOSCOPIC CONCHA BULLOSA RESECTION: SHX6395

## 2020-10-11 HISTORY — PX: SINUS ENDO WITH FUSION: SHX5329

## 2020-10-11 HISTORY — PX: SEPTOPLASTY WITH ETHMOIDECTOMY, AND MAXILLARY ANTROSTOMY: SHX6090

## 2020-10-11 HISTORY — PX: NASAL SEPTOPLASTY W/ TURBINOPLASTY: SHX2070

## 2020-10-11 SURGERY — SEPTOPLASTY, NOSE, WITH NASAL TURBINATE REDUCTION
Anesthesia: General | Site: Nose | Laterality: Right

## 2020-10-11 MED ORDER — ONDANSETRON HCL 4 MG/2ML IJ SOLN: 4.0000 mg | INTRAMUSCULAR | Status: DC | PRN

## 2020-10-11 MED ORDER — PREDNISONE 10 MG PO TABS: ORAL_TABLET | ORAL | 0 refills | Status: AC

## 2020-10-11 MED ORDER — DOCUSATE SODIUM 100 MG PO CAPS: 100.0000 mg | ORAL_CAPSULE | Freq: Two times a day (BID) | ORAL | 0 refills | Status: AC

## 2020-10-11 MED ORDER — SCOPOLAMINE 1 MG/3DAYS TD PT72
1.0000 | MEDICATED_PATCH | TRANSDERMAL | Status: DC
  Administered 2020-10-11: 1.5 mg via TRANSDERMAL
  Filled 2020-10-11: qty 1

## 2020-10-11 MED ORDER — HEMOSTATIC AGENTS (NO CHARGE) OPTIME
TOPICAL | Status: DC | PRN
  Administered 2020-10-11: 1

## 2020-10-11 MED ORDER — PROPOFOL 10 MG/ML IV BOLUS
INTRAVENOUS | Status: AC
  Filled 2020-10-11: qty 20

## 2020-10-11 MED ORDER — PHENYLEPHRINE 40 MCG/ML (10ML) SYRINGE FOR IV PUSH (FOR BLOOD PRESSURE SUPPORT)
PREFILLED_SYRINGE | INTRAVENOUS | Status: AC
  Filled 2020-10-11: qty 10

## 2020-10-11 MED ORDER — ONDANSETRON HCL 4 MG/2ML IJ SOLN
INTRAMUSCULAR | Status: AC
  Filled 2020-10-11: qty 2

## 2020-10-11 MED ORDER — ACETAZOLAMIDE ER 500 MG PO CP12
500.0000 mg | ORAL_CAPSULE | Freq: Two times a day (BID) | ORAL | Status: DC
  Administered 2020-10-11 – 2020-10-12 (×2): 500 mg via ORAL
  Filled 2020-10-11 (×2): qty 1

## 2020-10-11 MED ORDER — LACTATED RINGERS IV SOLN: INTRAVENOUS | Status: DC

## 2020-10-11 MED ORDER — ORAL CARE MOUTH RINSE: 15.0000 mL | Freq: Once | OROMUCOSAL | Status: AC

## 2020-10-11 MED ORDER — OXYMETAZOLINE HCL 0.05 % NA SOLN
NASAL | Status: DC | PRN
  Administered 2020-10-11: 1 via TOPICAL

## 2020-10-11 MED ORDER — 0.9 % SODIUM CHLORIDE (POUR BTL) OPTIME
TOPICAL | Status: DC | PRN
  Administered 2020-10-11: 1000 mL

## 2020-10-11 MED ORDER — PROMETHAZINE HCL 25 MG/ML IJ SOLN: 6.2500 mg | INTRAMUSCULAR | Status: DC | PRN

## 2020-10-11 MED ORDER — PHENYLEPHRINE HCL (PRESSORS) 10 MG/ML IV SOLN
INTRAVENOUS | Status: DC | PRN
  Administered 2020-10-11: 80 ug via INTRAVENOUS
  Administered 2020-10-11: 40 ug via INTRAVENOUS
  Administered 2020-10-11: 80 ug via INTRAVENOUS

## 2020-10-11 MED ORDER — SUGAMMADEX SODIUM 200 MG/2ML IV SOLN
INTRAVENOUS | Status: DC | PRN
  Administered 2020-10-11: 120 mg via INTRAVENOUS

## 2020-10-11 MED ORDER — SODIUM CHLORIDE 0.9 % IR SOLN
Status: DC | PRN
  Administered 2020-10-11: 1000 mL

## 2020-10-11 MED ORDER — HYDROCODONE-ACETAMINOPHEN 5-325 MG PO TABS
1.0000 | ORAL_TABLET | ORAL | Status: DC | PRN
  Administered 2020-10-11: 2 via ORAL
  Filled 2020-10-11: qty 2

## 2020-10-11 MED ORDER — FENTANYL CITRATE (PF) 100 MCG/2ML IJ SOLN
INTRAMUSCULAR | Status: DC | PRN
  Administered 2020-10-11: 50 ug via INTRAVENOUS
  Administered 2020-10-11: 25 ug via INTRAVENOUS
  Administered 2020-10-11 (×2): 50 ug via INTRAVENOUS

## 2020-10-11 MED ORDER — BACITRACIN ZINC 500 UNIT/GM EX OINT
TOPICAL_OINTMENT | CUTANEOUS | Status: DC | PRN
  Administered 2020-10-11: 1 via TOPICAL

## 2020-10-11 MED ORDER — LIDOCAINE-EPINEPHRINE 1 %-1:100000 IJ SOLN
INTRAMUSCULAR | Status: DC | PRN
  Administered 2020-10-11: 3 mL
  Administered 2020-10-11: 10 mL

## 2020-10-11 MED ORDER — FENTANYL CITRATE (PF) 100 MCG/2ML IJ SOLN
25.0000 ug | INTRAMUSCULAR | Status: DC | PRN
  Administered 2020-10-11 (×2): 50 ug via INTRAVENOUS

## 2020-10-11 MED ORDER — OXYMETAZOLINE HCL 0.05 % NA SOLN
NASAL | Status: AC
  Filled 2020-10-11: qty 30

## 2020-10-11 MED ORDER — FENTANYL CITRATE (PF) 100 MCG/2ML IJ SOLN
INTRAMUSCULAR | Status: AC
  Filled 2020-10-11: qty 2

## 2020-10-11 MED ORDER — ACETAMINOPHEN 500 MG PO TABS
1000.0000 mg | ORAL_TABLET | Freq: Once | ORAL | Status: AC
  Administered 2020-10-11: 1000 mg via ORAL
  Filled 2020-10-11: qty 2

## 2020-10-11 MED ORDER — HYDROCODONE-ACETAMINOPHEN 5-325 MG PO TABS: 1.0000 | ORAL_TABLET | ORAL | 0 refills | Status: AC | PRN

## 2020-10-11 MED ORDER — CEFAZOLIN SODIUM-DEXTROSE 2-4 GM/100ML-% IV SOLN
2.0000 g | INTRAVENOUS | Status: AC
  Administered 2020-10-11: 2 g via INTRAVENOUS
  Filled 2020-10-11: qty 100

## 2020-10-11 MED ORDER — ROCURONIUM BROMIDE 100 MG/10ML IV SOLN
INTRAVENOUS | Status: DC | PRN
  Administered 2020-10-11: 50 mg via INTRAVENOUS

## 2020-10-11 MED ORDER — AMISULPRIDE (ANTIEMETIC) 5 MG/2ML IV SOLN: 10.0000 mg | Freq: Once | INTRAVENOUS | Status: DC | PRN

## 2020-10-11 MED ORDER — HYDROCODONE-ACETAMINOPHEN 5-325 MG PO TABS
1.0000 | ORAL_TABLET | ORAL | Status: DC | PRN
  Administered 2020-10-11 – 2020-10-12 (×4): 2 via ORAL
  Filled 2020-10-11 (×4): qty 2

## 2020-10-11 MED ORDER — SODIUM CHLORIDE 0.9 % IV SOLN: INTRAVENOUS | Status: DC

## 2020-10-11 MED ORDER — LIDOCAINE HCL (CARDIAC) PF 100 MG/5ML IV SOSY
PREFILLED_SYRINGE | INTRAVENOUS | Status: DC | PRN
  Administered 2020-10-11: 60 mg via INTRAVENOUS

## 2020-10-11 MED ORDER — MIDAZOLAM HCL 2 MG/2ML IJ SOLN
INTRAMUSCULAR | Status: AC
  Filled 2020-10-11: qty 2

## 2020-10-11 MED ORDER — ONDANSETRON HCL 4 MG PO TABS: 4.0000 mg | ORAL_TABLET | ORAL | Status: DC | PRN

## 2020-10-11 MED ORDER — POLYETHYLENE GLYCOL 3350 17 G PO PACK: 17.0000 g | PACK | Freq: Every day | ORAL | Status: DC | PRN

## 2020-10-11 MED ORDER — ONDANSETRON HCL 4 MG/2ML IJ SOLN
INTRAMUSCULAR | Status: DC | PRN
  Administered 2020-10-11: 4 mg via INTRAVENOUS

## 2020-10-11 MED ORDER — DOCUSATE SODIUM 100 MG PO CAPS
100.0000 mg | ORAL_CAPSULE | Freq: Two times a day (BID) | ORAL | Status: DC
  Administered 2020-10-11 – 2020-10-12 (×2): 100 mg via ORAL
  Filled 2020-10-11 (×2): qty 1

## 2020-10-11 MED ORDER — BACITRACIN ZINC 500 UNIT/GM EX OINT
TOPICAL_OINTMENT | CUTANEOUS | Status: AC
  Filled 2020-10-11: qty 28.35

## 2020-10-11 MED ORDER — MIDAZOLAM HCL 2 MG/2ML IJ SOLN
INTRAMUSCULAR | Status: DC | PRN
  Administered 2020-10-11: 2 mg via INTRAVENOUS

## 2020-10-11 MED ORDER — DEXAMETHASONE SODIUM PHOSPHATE 10 MG/ML IJ SOLN
INTRAMUSCULAR | Status: DC | PRN
  Administered 2020-10-11: 10 mg via INTRAVENOUS

## 2020-10-11 MED ORDER — EPHEDRINE SULFATE-NACL 50-0.9 MG/10ML-% IV SOSY
PREFILLED_SYRINGE | INTRAVENOUS | Status: DC | PRN
  Administered 2020-10-11 (×3): 5 mg via INTRAVENOUS
  Administered 2020-10-11: 15 mg via INTRAVENOUS

## 2020-10-11 MED ORDER — CHLORHEXIDINE GLUCONATE 0.12 % MT SOLN
15.0000 mL | Freq: Once | OROMUCOSAL | Status: AC
  Administered 2020-10-11: 15 mL via OROMUCOSAL
  Filled 2020-10-11: qty 15

## 2020-10-11 MED ORDER — ACETAZOLAMIDE ER 500 MG PO CP12: 500.0000 mg | ORAL_CAPSULE | Freq: Two times a day (BID) | ORAL | 0 refills | Status: AC

## 2020-10-11 MED ORDER — PROPOFOL 10 MG/ML IV BOLUS
INTRAVENOUS | Status: DC | PRN
  Administered 2020-10-11: 140 mg via INTRAVENOUS

## 2020-10-11 MED ORDER — CEFADROXIL 500 MG PO CAPS: 500.0000 mg | ORAL_CAPSULE | Freq: Two times a day (BID) | ORAL | 0 refills | Status: AC

## 2020-10-11 MED ORDER — FENTANYL CITRATE (PF) 250 MCG/5ML IJ SOLN
INTRAMUSCULAR | Status: AC
  Filled 2020-10-11: qty 5

## 2020-10-11 MED ORDER — STERILE WATER FOR IRRIGATION IR SOLN
Status: DC | PRN
  Administered 2020-10-11: 1000 mL

## 2020-10-11 MED ORDER — LIDOCAINE-EPINEPHRINE 1 %-1:100000 IJ SOLN
INTRAMUSCULAR | Status: AC
  Filled 2020-10-11: qty 1

## 2020-10-11 MED ORDER — CELECOXIB 200 MG PO CAPS
200.0000 mg | ORAL_CAPSULE | Freq: Once | ORAL | Status: AC
  Administered 2020-10-11: 200 mg via ORAL
  Filled 2020-10-11: qty 1

## 2020-10-11 SURGICAL SUPPLY — 42 items
BAG COUNTER SPONGE SURGICOUNT (BAG) ×3 IMPLANT
BALLN FRONTAL NUVENT 6X17 (BALLOONS) ×3
BALLOON FRONTAL NUVENT 6X17 (BALLOONS) ×2 IMPLANT
BLADE INF TURB ROT M4 2 5PK (BLADE) ×3 IMPLANT
BLADE ROTATE RAD 40 4 M4 (BLADE) ×3 IMPLANT
BLADE ROTATE TRICUT 4X13 M4 (BLADE) ×3 IMPLANT
CANISTER SUCT 3000ML PPV (MISCELLANEOUS) ×6 IMPLANT
DRSG NASOPORE 8CM (GAUZE/BANDAGES/DRESSINGS) ×3 IMPLANT
ELECT REM PT RETURN 9FT ADLT (ELECTROSURGICAL) ×3
ELECTRODE REM PT RTRN 9FT ADLT (ELECTROSURGICAL) ×2 IMPLANT
GLOVE SURG ENC MOIS LTX SZ6.5 (GLOVE) ×3 IMPLANT
GOWN STRL REUS W/ TWL LRG LVL3 (GOWN DISPOSABLE) ×6 IMPLANT
GOWN STRL REUS W/TWL LRG LVL3 (GOWN DISPOSABLE) ×3
HEMOSTAT ARISTA ABSORB 3G PWDR (HEMOSTASIS) ×3 IMPLANT
IMPL PROPEL MINI SINUS SDS (Prosthesis and Implant ENT) ×2 IMPLANT
IMPLANT PROPEL MINI SINUS SDS (Prosthesis and Implant ENT) ×3 IMPLANT
INFLATOR BALLN (BALLOONS) ×1
INFLATOR BALLOON W/TUBE (BALLOONS) ×2 IMPLANT
KIT BASIN OR (CUSTOM PROCEDURE TRAY) ×3 IMPLANT
KIT TURNOVER KIT B (KITS) ×3 IMPLANT
NEEDLE HYPO 25GX1X1/2 BEV (NEEDLE) ×6 IMPLANT
NEEDLE SPNL 25GX3.5 QUINCKE BL (NEEDLE) ×3 IMPLANT
NS IRRIG 1000ML POUR BTL (IV SOLUTION) ×3 IMPLANT
PAD ARMBOARD 7.5X6 YLW CONV (MISCELLANEOUS) ×6 IMPLANT
PATTIES SURGICAL .5 X3 (DISPOSABLE) ×3 IMPLANT
SEALANT ADHERUS EXTEND TIP (MISCELLANEOUS) ×3 IMPLANT
SOL ANTI FOG 6CC (MISCELLANEOUS) ×2 IMPLANT
SOLUTION ANTI FOG 6CC (MISCELLANEOUS) ×1
SPLINT NASAL DOYLE BI-VL (GAUZE/BANDAGES/DRESSINGS) ×3 IMPLANT
SPONGE SURGIFOAM ABS GEL 12-7 (HEMOSTASIS) ×3 IMPLANT
SUT CHROMIC 4 0 P 3 18 (SUTURE) ×3 IMPLANT
SUT PLAIN 4 0 ~~LOC~~ 1 (SUTURE) ×3 IMPLANT
SUT SILK 2 0 SH (SUTURE) ×3 IMPLANT
SYR CONTROL 10ML LL (SYRINGE) ×3 IMPLANT
SYR TB 1ML LUER SLIP (SYRINGE) ×6 IMPLANT
TOWEL GREEN STERILE FF (TOWEL DISPOSABLE) ×3 IMPLANT
TRACKER ENT INSTRUMENT (MISCELLANEOUS) ×3 IMPLANT
TRACKER ENT PATIENT (MISCELLANEOUS) ×3 IMPLANT
TRAY ENT MC OR (CUSTOM PROCEDURE TRAY) ×3 IMPLANT
TUBE CONNECTING 12X1/4 (SUCTIONS) ×3 IMPLANT
TUBE SALEM SUMP 16 FR W/ARV (TUBING) ×3 IMPLANT
TUBING STRAIGHTSHOT EPS 5PK (TUBING) ×3 IMPLANT

## 2020-10-11 NOTE — Op Note (Signed)
OPERATIVE NOTE  Todd Kennedy Date/Time of Admission: 10/11/2020  8:05 AM  CSN: [id number] Attending Provider: Cheron Schaumann A, DO Room/Bed: 3C02C/3C02C-01 DOB: July 19, 1965 Age: 55 y.o.   Pre-Op Diagnosis: Deviated Septum/Turbinate Hypertrophy/Recurrent Sinusitis  Post-Op Diagnosis: Deviated Septum/Turbinate Hypertrophy/Recurrent Sinusitis  Procedure: Procedure(s): NASAL SEPTOPLASTY WITH BILATERAL INFERIOR TURBINATE REDUCTION ENDOSCOPIC SINUS SURGERY WITH FUSION WITH PROPEL LEFT SIDE, REPAIR IATROGENIC CEREBROSPINAL FLUID LEAK  RIGHT SIDE,  BILATERAL FRONTAL RECESS EXPLORATION, ENDOSCOPIC CONCHA BULLOSA RESECTION, RIGHT SEPTOPLASTY WITH BILATERAL TOTAL ETHMOIDECTOMY, BILATERAL MAXILLARY ANTROSTOMY WITH TISSUE REMOVAL, BILATERAL SPHENOIDOTOMY WITH TISSUE REMOVAL  Anesthesia: General  Surgeon(s): Essam Lowdermilk A Carli Lefevers, DO  Staff: Circulator: Josefa Half, RN Relief Circulator: Pietro Cassis, RN; Woodroe Mode, RN Relief Scrub: Pietro Cassis, RN Scrub Person: Carmela Rima  Implants: Implant Name Type Inv. Item Serial No. Manufacturer Lot No. LRB No. Used Action  Propel Mini Prosthesis and Implant ENT   INTERSECT ENT [id number]  1 Implanted    Specimens: ID Type Source Tests Collected by Time Destination  1 : Bilateral Sinus Contents Tissue PATH Sinus Contents/Nasal Polyps SURGICAL PATHOLOGY Todd Kennedy A, DO 10/11/2020 1133     Complications: Iatrogenic CSF leak at the right lateral lamella of the cribiform plate  EBL: 657 ML  Condition: stable  Operative Findings:  Extensive bilateral nasal polyposis with complete obstruction of all sinuses. Right concha bullosa, Severe left septal deviation, bilateral inferior turbinate hypertrophy  Description of Operation: Once operative consent was obtained and the site and surgery were confirmed with the patient and the operating room team, the patient was brought back to the operating  room and general endotracheal anesthesia was obtained. The image-guided system was attached and noted to be in good calibration. Lidocaine 1% with 1:100,000 epinephrine was injected into the nasal septum bilaterally, inferior turbinates bilaterally, the middle turbinates bilaterally, and the axilla between the medial turbinate and the lateral nasal wall. Afrin-soaked pledgets were placed into the nasal cavity, and the patient was prepped and draped in sterile fashion. Attention was first turned to the left nasal vestibule. A left sided hemi-transfixion incision was made a submucoperichondrial flap was elevated on the left.  A submucous resection of  nasal septal cartilage was performed with care taken to leave a 1 cm caudal and dorsal strut. In doing this, a right-sided submucoperichondrial flap was elevated.  The bony nasal septum was addressed by lifting up the soft tissues, separating the superior septum with a double action scissor and then removing the inferior deflected portion. The nasal spine was removed using the Risk analyst and Lenoria Chime. With this completed, the nasal septum was midline. The submucoperichondrial flaps were returned to their anatomic position and hemi-transfixion incision was closed with interrupted 4-0 chromic gut. A 4-0 plain gut suture was used to perform a mattress style stitch in a circular direction from anterior to posterior septum to re-approximate the right and left sided flaps.  Attention then turned to the right-sided sinonasal cavity. Extensive polypoid tissue from the middle meatus was was immediately encountered and removed using the microdebrider. The middle turbinate was medialized and the right concha bullosa was resected using a combination of a sickle knife, turbinate scissor, and true-cut forceps. The excised mucosa and bone was placed in saline. A ball-tipped seeker was used to slightly anterior fracture the uncinate process.  The frontal recess was dilated to 6 mm  in two locations with a navigatable frontal balloon. The inferior frontal recess was then explored and widened. Attention was then turned to  the uncinate process, which was completely fractured anteriorly and then removed with a combination of backbiter and a microdebrider. A ball-tipped seeker was used to identify the natural os of the maxillary sinus, and it was widened with combination of the backbiter, the microdebrider, the olive tip suction, and the straight TruCut until it was widely patent. Polypoid tissue was removed. The frontal and maxillary sinus cavities were irrigated. Utilizing image-guided suction, the medial inferior quadrant of the ethmoid bulla was entered bluntly, the walls were fractured and the bulla was removed utilizing a microdebrider. Anterior and posterior ethmoidectomy were completed in a standard fashion by first locating the anterior face of the sphenoid sinus and then following the skull base and the lamina papyracea to fully take down all ethmoid cells. This was done utilizing a combination of the straight suction, the J curette, the ball-tipped seeker, and the microdebrider. The os of the sphenoid sinus was identified medially and inferiorly and entered with image guided suction. It was then widened with a micro debrider.  Polypoid tissue was removed from the sinus and os. In removing additional polypoid tissue along the skull base, a CSF leak was noted at the lateral lamella. This was thoroughly inspected and a small bony defect measuring less than 48mm was noted, with exposed dura. Gel foam was packed along the dura, and the previously removed mucosa and bone from the concha bullosa was used as a free graft to repair the visible fluid leak. The Neurosurgeon on call, Dr. Franky Macho, kindly assessed the repair and agreed that there was no visible leak with valsalva maneuver. Adheris was then placed over the site of repair, and additional Gelfoam was packed over the Adheris. The site was  again thoroughly inspected, with no leak appreciated. Nasopore was packed in the nasal cavity, lateral to the middle turbinate. Attention was then turned to the left side. Extensive polypoid tissue from the middle meatus and medial to the middle turbinate was was immediately encountered and removed using the microdebrider. The middle turbinate was medialized and a ball-tipped seeker was used to slightly anterior fracture the uncinate process.  The frontal recess was dilated to 6 mm in two locations with a navigatable frontal balloon. The inferior frontal recess was then explored and widened. Attention was then turned to the uncinate process, which was completely fractured anteriorly and then removed with a combination of backbiter and a microdebrider. A ball-tipped seeker was used to identify the natural os of the maxillary sinus, and it was widened with combination of the backbiter, the microdebrider, the olive tip suction, and the straight TruCut until it was widely patent. Polypoid tissue was removed. The frontal and maxillary sinus cavities were irrigated. Utilizing image-guided suction, the medial inferior quadrant of the ethmoid bulla was entered bluntly, the walls were fractured and the bulla was removed utilizing a microdebrider. Anterior and posterior ethmoidectomy were completed in a standard fashion by first locating the anterior face of the sphenoid sinus and then following the skull base and the lamina papyracea to fully take down all ethmoid cells. This was done utilizing a combination of the straight suction, the J curette, the ball-tipped seeker, and the microdebrider. The os of the sphenoid sinus was identified medially and inferiorly and entered with image guided suction. It was then widened with a micro debrider.  Polypoid tissue was removed from the sinus and os..  After the sinus was copiously irrigated, Arista was placed in the nasal cavity, followed by a mini propel stent, and Nasopore  nasal  packing was placed lateral to the middle turbinate in the axilla between it and the lateral nasal wall.  Attention was then turned to the inferior turbinates.They were outfractured and then submucous resection was performed by making an incision in the leading edge with a 15 blade, separating the mucosa from bone with a Cottle elevator and then using the micro debrider with a turbinate blade to remove bone.  Doyle nasal splints were placed in the bilateral nasal cavities and sutured to the columella with a 2-0 silk suture and a nasal drip pad was applied. An orogastric tube was placed and the stomach cavity was suctioned to reduce postoperative nausea. The patient was turned over to anesthesia service and was extubated in the operating room and transferred to the PACU in stable condition. Due to intraoperative CSF leak, the patient will be admitted for overnight observation and followed up in the ENT clinic in 1 week for postoperative check.    Laren Boom, DO [facility] ENT  10/11/2020

## 2020-10-11 NOTE — Anesthesia Procedure Notes (Addendum)
Procedure Name: Intubation Date/Time: 10/11/2020 11:09 AM Performed by: Imagene Riches, CRNA Pre-anesthesia Checklist: Patient identified, Patient being monitored, Timeout performed, Emergency Drugs available and Suction available Patient Re-evaluated:Patient Re-evaluated prior to induction Oxygen Delivery Method: Circle System Utilized Preoxygenation: Pre-oxygenation with 100% oxygen Induction Type: IV induction Ventilation: Mask ventilation without difficulty Laryngoscope Size: Miller and 2 Grade View: Grade II Tube type: Oral Tube size: 7.0 mm Number of attempts: 2 Airway Equipment and Method: stylet and Stylet Placement Confirmation: ETT inserted through vocal cords under direct vision, positive ETCO2 and breath sounds checked- equal and bilateral Secured at: 21 cm Tube secured with: Tape Dental Injury: Teeth and Oropharynx as per pre-operative assessment  Difficulty Due To: Difficult Airway- due to anterior larynx Comments: DL times 2 by Jaclyn Shaggy Verl Whitmore with mac 3 and mac 4. Unable to view cords. Patient has anterior airway. Intubated by Beverlee Nims with head lift by myself and anterior pressure by Dr. Lissa Hoard., grade 2 view.

## 2020-10-11 NOTE — Brief Op Note (Signed)
10/11/2020  1:59 PM  PATIENT:  Todd Kennedy  55 y.o. male  PRE-OPERATIVE DIAGNOSIS:  Deviated Septum/Turbinate Hypertrophy/Recurrent Sinusitis  POST-OPERATIVE DIAGNOSIS:  Deviated Septum/Turbinate Hypertrophy/Recurrent Sinusitis  PROCEDURE:  Procedure(s): NASAL SEPTOPLASTY WITH TURBINATE REDUCTION (Bilateral) SINUS ENDOSCOPIC WITH FUSION WITH PROPEL LEFT SIDE AND REPAIR IATROGENIC CEREBROSPINAL FLUID LEAK  RIGHT SIDE (Bilateral) BALLOON SINUPLASTY-FRONTAL (Bilateral) ENDOSCOPIC CONCHA BULLOSA RESECTION (Right) SEPTOPLASTY WITH ETHMOIDECTOMY, AND MAXILLARY ANTROSTOMY (Bilateral) SPHENOIDECTOMY (Bilateral)  SURGEON:  Surgeon(s) and Role:    * Tkeyah Burkman A, DO - Primary  PHYSICIAN ASSISTANT:   ASSISTANTS: none   ANESTHESIA:   general  EBL:  140 mL   BLOOD ADMINISTERED:none  DRAINS: none   LOCAL MEDICATIONS USED:  LIDOCAINE   SPECIMEN:  Source of Specimen:  Bilateral sinus contents  DISPOSITION OF SPECIMEN:  PATHOLOGY  COUNTS:  YES  TOURNIQUET:  * No tourniquets in log *  DICTATION: .Note written in EPIC  PLAN OF CARE: Admit for overnight observation  PATIENT DISPOSITION:  PACU - hemodynamically stable.   Delay start of Pharmacological VTE agent (>24hrs) due to surgical blood loss or risk of bleeding: yes

## 2020-10-11 NOTE — Transfer of Care (Signed)
Immediate Anesthesia Transfer of Care Note  Patient: Todd Kennedy  Procedure(s) Performed: NASAL SEPTOPLASTY WITH TURBINATE REDUCTION (Bilateral: Nose) SINUS ENDOSCOPIC WITH FUSION WITH PROPEL LEFT SIDE AND REPAIR IATROGENIC CEREBROSPINAL FLUID LEAK  RIGHT SIDE (Bilateral: Nose) BALLOON SINUPLASTY-FRONTAL (Bilateral: Nose) ENDOSCOPIC CONCHA BULLOSA RESECTION (Right: Nose) SEPTOPLASTY WITH ETHMOIDECTOMY, AND MAXILLARY ANTROSTOMY (Bilateral: Nose) SPHENOIDECTOMY (Bilateral: Nose)  Patient Location: PACU  Anesthesia Type:General  Level of Consciousness: drowsy  Airway & Oxygen Therapy: Patient Spontanous Breathing and Patient connected to face mask oxygen  Post-op Assessment: Report given to RN and Post -op Vital signs reviewed and stable  Post vital signs: Reviewed and stable  Last Vitals:  Vitals Value Taken Time  BP 120/72 10/11/20 1405  Temp    Pulse 64 10/11/20 1407  Resp 10 10/11/20 1407  SpO2 100 % 10/11/20 1407  Vitals shown include unvalidated device data.  Last Pain:  Vitals:   10/11/20 0817  TempSrc: Oral         Complications: No notable events documented.

## 2020-10-11 NOTE — Progress Notes (Signed)
Subjective: Postop visit, he is awake and alert.  He is sitting semi-upright in the bed.  He complains of a minor amount of discomfort.  Objective: Vital signs in last 24 hours: Temp:  [97 F (36.1 C)-97.9 F (36.6 C)] 97.6 F (36.4 C) (07/08 1513) Pulse Rate:  [57-76] 57 (07/08 1513) Resp:  [12-20] 18 (07/08 1513) BP: (120-131)/(72-78) 127/75 (07/08 1513) SpO2:  [92 %-96 %] 94 % (07/08 1513) Weight:  [66.7 kg] 66.7 kg (07/08 0817) Weight change:     Intake/Output from previous day: No intake/output data recorded. Intake/Output this shift: Total I/O In: 1600 [I.V.:1500; IV Piggyback:100] Out: 140 [Blood:140]  PHYSICAL EXAM: Awake and alert.  EOMs are intact.  No eye swelling.  Packing in place.  Minimal bloody discharge.  Lab Results: No results for input(s): WBC, HGB, HCT, PLT in the last 72 hours. BMET No results for input(s): NA, K, CL, CO2, GLUCOSE, BUN, CREATININE, CALCIUM in the last 72 hours.  Studies/Results: No results found.  Medications: I have reviewed the patient's current medications.  Assessment/Plan: Stable postop.  Continue to monitor.  Keep head elevated.  Discharge in the morning if stable.  LOS: 0 days   Serena Colonel 10/11/2020, 5:07 PM

## 2020-10-11 NOTE — H&P (Signed)
Todd Kennedy is an 55 y.o. male.    Chief Complaint:  Nasal polyposis  HPI: Patient presents today for planned elective procedure.  He denies any interval change in history since office visit on 09/09/2020:  Todd Kennedy is a 55 y.o. male who presents as a return patient for follow-up of nasal polyposis. Last visit, patient has been treated with a course of oral steroids and Xhance nasal spray. A CT scan of the sinuses was obtained today to determine response to treatment. Patient states he noted some improvement while on the oral steroids, but notes persistent nasal congestion and lack of smell at this time. Denies purulent nasal drainage or significant facial pressure or pain.  Last visit 08/15/2020: Todd Kennedy is a 55 y.o. male who presents as a new patient, referred by Self, A Referral, for evaluation and treatment of nasal congestion, loss of taste and smell. Patient states that his symptoms have been present for approximately 1 year. He denies any significant URI symptoms around time of onset. He states that he had COVID, and actually recovered some sense of smell and taste after his infection, but this was not associated with the onset of the symptoms. He endorses history of nasal trauma as a child, denies history of nasal surgery. He states that his nasal congestion is consistently worse on the left side. He has used Flonase consistently for several months without any improvement, and is no longer using the medication routinely. He states that he uses a nasal lavage on a daily basis, which temporarily helps improve his breathing. He denies significant facial pain or pressure. He has not received treatment with oral antibiotics or steroids for sinusitis within the last 12 months. He has not been formally allergy tested, but endorses allergy symptoms in the spring for which he uses an antihistamine as needed. He is a former smoker, but quit over 10 years ago. He has not had any recent imaging of his  head or sinuses. He sees a dentist routinely, and states that he has a root canal scheduled at the end of the month for a left maxillary molar.  Past Medical History:  Diagnosis Date   Asthma 2017   Allergy induced asthma   Coronary artery disease    s/p stent to LAD 2013 at [facility]   Elevated blood pressure    Previously took antihypertensives   Hyperlipidemia    Statin therapy initiated 2012   Hypertension     Past Surgical History:  Procedure Laterality Date   CORONARY STENT PLACEMENT  2012   THYROIDECTOMY, PARTIAL Left 2019   Left hemithyroidectomy    Family History  Adopted: Yes    Social History:  reports that he quit smoking about 10 years ago. His smoking use included cigarettes. He has never used smokeless tobacco. He reports that he does not drink alcohol and does not use drugs.  Allergies: No Known Allergies  Medications Prior to Admission  Medication Sig Dispense Refill   aspirin 81 MG tablet Take 81 mg by mouth daily.       atenolol (TENORMIN) 25 MG tablet Take 25 mg by mouth daily.     Multiple Vitamin (MULTIVITAMIN) capsule Take 1 capsule by mouth daily.       simvastatin (ZOCOR) 40 MG tablet Take 40 mg by mouth at bedtime.     albuterol (VENTOLIN HFA) 108 (90 Base) MCG/ACT inhaler Inhale 2 puffs into the lungs every 6 (six) hours as needed for shortness of breath.  No results found for this or any previous visit (from the past 48 hour(s)). No results found.  ROS: ROS  Blood pressure 121/76, pulse (!) 59, temperature 97.9 F (36.6 C), temperature source Oral, resp. rate 20, height 5\' 6"  (1.676 m), weight 66.7 kg, SpO2 95 %.  PHYSICAL EXAM: General: Well developed, well nourished. No acute distress. Voice intact, no voice breaks  Head/Face: Normocephalic, atraumatic. No scars or lesions. No sinus tenderness. Facial nerve intact and equal bilaterally.  Eyes: Pupils are equal, round and reactive to light. Conjunctiva and lids are normal. Normal  extraocular mobility.  Ears: Right: Pinna and external meatus normal Left: Pinna and external meatus normal  Nose: No gross deformity or lesions. No purulent discharge. Polypoid tissue noted in left nasal cavity on anterior rhinoscopy. Septum deviated to the left 3+, 2+ turbinate hypertrophy  Neck: Trachea midline. Lymphatic: No lymphadenopathy in the neck.  Respiratory: No stridor or distress.  Extremities: No edema or cyanosis. Warm and well-perfused.  Neurologic: CN II-XII intact. Alert and oriented to self, place and time. Normal reflexes and motor skills, balance and coordination. Moving all extremities without gross abnormality.  Psychiatric: No unusual anxiety or evidence of depression. Appropriate affect.  Studies Reviewed: CT Sinus reviewed   Assessment/Plan Tyray Proch is a 55 y.o. male with 1 year history of significant nasal congestion, left greater than right, hyposmia, hypogeusia. CT Sinus demonstrated complete opacification of bilateral paranasal sinuses with polypoid mucosal changes.  - To OR today for bilateral functional endoscopic surgery with image guidance, with septoplasty, bilateral turbinate reduction, bilateral maxillary antrostomy with tissue removal, bilateral total ethmoidectomy, bilateral sphenoidotomy, and left frontal recess exploration with Propel stent placement. Risks of surgery and postoperative recovery were reviewed with patient, all questions answered.    Noami Bove A Kamrynn Melott 10/11/2020, 10:10 AM

## 2020-10-11 NOTE — Anesthesia Postprocedure Evaluation (Signed)
Anesthesia Post Note  Patient: Todd Kennedy  Procedure(s) Performed: NASAL SEPTOPLASTY WITH TURBINATE REDUCTION (Bilateral: Nose) SINUS ENDOSCOPIC WITH FUSION WITH PROPEL LEFT SIDE AND REPAIR IATROGENIC CEREBROSPINAL FLUID LEAK  RIGHT SIDE (Bilateral: Nose) BALLOON SINUPLASTY-FRONTAL (Bilateral: Nose) ENDOSCOPIC CONCHA BULLOSA RESECTION (Right: Nose) SEPTOPLASTY WITH ETHMOIDECTOMY, AND MAXILLARY ANTROSTOMY (Bilateral: Nose) SPHENOIDECTOMY (Bilateral: Nose)     Patient location during evaluation: PACU Anesthesia Type: General Level of consciousness: awake and alert and oriented Pain management: pain level controlled Vital Signs Assessment: post-procedure vital signs reviewed and stable Respiratory status: spontaneous breathing, nonlabored ventilation and respiratory function stable Cardiovascular status: blood pressure returned to baseline and stable Postop Assessment: no apparent nausea or vomiting Anesthetic complications: no   No notable events documented.  Last Vitals:  Vitals:   10/11/20 1452 10/11/20 1513  BP: 131/78 127/75  Pulse: 72 (!) 57  Resp: 16 18  Temp: (!) 36.1 C 36.4 C  SpO2: 95% 94%    Last Pain:  Vitals:   10/11/20 1513  TempSrc: Oral  PainSc:                  Randale Carvalho A.

## 2020-10-11 NOTE — Discharge Instructions (Addendum)
[facility] Post Operative Instructions  Office: [phone]  The Surgery Itself Endoscopic sinus surgery (with or without septoplasty and turbinate reduction) involves general anesthesia, typically for one to two hours. Patients may be sedated for several hours after surgery and may remain sleepy for the better part of the day. Nausea and vomiting are occasionally seen, and usually resolve by the evening of surgery - even without additional medications. Almost all patients can go home the day of surgery.  After Surgery  Facial pressure and fullness similar to a sinus infection/headache is normal after surgery. Breathing through your nose is also difficult due to swelling. A humidifier or vaporizer can be used in the bedroom to prevent throat pain with mouth breathing.   Bloody nasal drainage is normal after this surgery for 5-7 days, usually decreasing in volume with each day that passes. Drainage will flow from the front of the nose and down the back of the throat. Make sure you spit out blood drainage that drips down the back of your throat to prevent nausea/vomiting. You will have a nasal drip pad/sling with gauze to catch drainage from the front of your nose. The dressing may need to be changed frequently during the first 24 hours following surgery. In case of profuse nasal bleeding, you may apply ice to the bridge of the nose and pinch the nose just above the tip and hold for 10 minutes; if bleeding continues, contact the doctors office.   Frequent hot showers or saline nasal rinses (NeilMed) will help break up congestion and clear any clot or mucus that builds up within the nose after surgery. This can be started the day after surgery.   It is more comfortable to sleep with extra pillows or in a recliner for the first few days after surgery until the drainage begins to resolve. You must sleep at a 30 degree incline for 2 weeks.    Do NOT blow your nose for 4 weeks  after surgery.   Avoid lifting > 10 lbs. and no exercise for 2 weeks after Surgery. NO straining or bending over. Take your stool softener as prescribed, and notify our office if you are experiencing any constipation.    Avoid airplane travel for 2 weeks following sinus surgery; the cabin pressure changes can cause pain and swelling within the nose/sinuses.   Sense of smell and taste are often diminished for several weeks after surgery. There may be some tenderness or numbness in your upper front teeth, which is normal after surgery. You may express old clot, discolored mucus or very large nasal crusts from your nose for up to 3-4 weeks after surgery; depending on how frequently and how effectively you irrigate your nose with the saltwater spray.   You may have absorbable sutures inside of your nose after surgery that will slowly dissolve in 2-3 weeks. Be careful when clearing crusts from the nose since they may be attached to these sutures.  Medications  Pain medication can be used for pain as prescribed. Pain and pressure in the nose is expected after surgery. As the surgical site heals, pain will resolve over the course of a week. Pain medications can cause nausea, which can be prevented if you take them with food or milk.   You may be given an antibiotic for one week after surgery to prevent infection. Take this medication with food to prevent nausea or vomiting.   You can use 2 nasal sprays after surgery: Afrin can be  used up to 2 times a day for up to 5 days after surgery (best before bed) to reduce bloody drainage from the nose for the first few days after surgery. Saline/salt water spray can be used as often as you would like starting the day after surgery to prevent crusting inside of the nose.   Take all of your routine medications as prescribed, unless told otherwise by your surgeon. Any medications that thin the blood should be avoided. This includes aspirin. Avoid aspirin-like products  for the first 72 hours after surgery (Advil, Motrin, Excedrin, Alieve, Celebrex, Naprosyn), but you may use them as needed for pain after 72 hours.

## 2020-10-12 DIAGNOSIS — J342 Deviated nasal septum: Secondary | ICD-10-CM | POA: Diagnosis not present

## 2020-10-12 NOTE — Progress Notes (Signed)
Patient was transported via wheelchair by NT for discharge home; in no acute distress nor complaints of pain nor discomfort; all belongings checked and accounted for; discharge instructions given to patient by RN and he verbalized understanding on the instructions given. 

## 2020-10-12 NOTE — Discharge Summary (Signed)
Physician Discharge Summary  Patient ID: Todd Kennedy MRN: [id number] DOB/AGE: Apr 11, 1965 55 y.o.  Admit date: 10/11/2020 Discharge date: 10/12/2020  Admission Diagnoses: Chronic sinusitis with polyposis  Discharge Diagnoses:  Active Problems:   Sinusitis with nasal polyps   Nasal septal deviation   Nasal polyposis CSF leak  Discharged Condition: good  Hospital Course: Did well overnight, no complications postop.  Consults: none  Significant Diagnostic Studies: none  Treatments: surgery: Bilateral endoscopic sinus surgery with repair of left cribriform CSF leak  Discharge Exam: Blood pressure 119/70, pulse (!) 46, temperature 98 F (36.7 C), temperature source Oral, resp. rate 18, height 5\' 6"  (1.676 m), weight 66.7 kg, SpO2 97 %. PHYSICAL EXAM: Awake and alert.  Nasal dressing and nasal packing in place.  Typical amounts of postop bloody drainage.  No obvious CSF leak.  Disposition: Discharge disposition: 01-Home or Self Care       Discharge Instructions     Diet - low sodium heart healthy   Complete by: As directed    Increase activity slowly   Complete by: As directed    No wound care   Complete by: As directed       Allergies as of 10/12/2020   No Known Allergies      Medication List     STOP taking these medications    aspirin 81 MG tablet       TAKE these medications    acetaZOLAMIDE ER 500 MG capsule Commonly known as: DIAMOX Take 1 capsule (500 mg total) by mouth every 12 (twelve) hours for 10 days.   albuterol 108 (90 Base) MCG/ACT inhaler Commonly known as: VENTOLIN HFA Inhale 2 puffs into the lungs every 6 (six) hours as needed for shortness of breath.   atenolol 25 MG tablet Commonly known as: TENORMIN Take 25 mg by mouth daily.   cefadroxil 500 MG capsule Commonly known as: DURICEF Take 1 capsule (500 mg total) by mouth 2 (two) times daily for 10 days.   docusate sodium 100 MG capsule Commonly known as: COLACE Take 1  capsule (100 mg total) by mouth 2 (two) times daily for 14 days.   HYDROcodone-acetaminophen 5-325 MG tablet Commonly known as: NORCO/VICODIN Take 1 tablet by mouth every 4 (four) hours as needed for up to 7 days for moderate pain.   multivitamin capsule Take 1 capsule by mouth daily.   predniSONE 10 MG tablet Commonly known as: DELTASONE Take 4 tablets (40 mg total) by mouth daily for 3 days, THEN 3 tablets (30 mg total) daily for 3 days, THEN 2 tablets (20 mg total) daily for 3 days, THEN 1 tablet (10 mg total) daily for 3 days. Start taking on: October 12, 2020   simvastatin 40 MG tablet Commonly known as: ZOCOR Take 40 mg by mouth at bedtime.         Signed: October 14, 2020 10/12/2020, 8:56 AM

## 2020-10-14 LAB — SURGICAL PATHOLOGY

## 2020-10-16 ENCOUNTER — Encounter (HOSPITAL_COMMUNITY): Payer: Self-pay | Admitting: Otolaryngology

## 2020-10-21 ENCOUNTER — Other Ambulatory Visit: Payer: Self-pay

## 2020-10-21 ENCOUNTER — Emergency Department (HOSPITAL_COMMUNITY)
Admission: EM | Admit: 2020-10-21 | Discharge: 2020-10-21 | Disposition: A | Discharge status: Home / Self Care | Payer: Managed Care, Other (non HMO) | Attending: Student | Admitting: Student

## 2020-10-21 DIAGNOSIS — R109 Unspecified abdominal pain: Secondary | ICD-10-CM | POA: Insufficient documentation

## 2020-10-21 DIAGNOSIS — Z5321 Procedure and treatment not carried out due to patient leaving prior to being seen by health care provider: Secondary | ICD-10-CM | POA: Insufficient documentation

## 2020-10-21 LAB — URINALYSIS, ROUTINE W REFLEX MICROSCOPIC
Bilirubin Urine: NEGATIVE
Glucose, UA: NEGATIVE mg/dL
Hgb urine dipstick: NEGATIVE
Ketones, ur: NEGATIVE mg/dL
Leukocytes,Ua: NEGATIVE
Nitrite: NEGATIVE
Protein, ur: NEGATIVE mg/dL
Specific Gravity, Urine: 1.026 (ref 1.005–1.030)
pH: 5 (ref 5.0–8.0)

## 2020-10-21 LAB — LIPASE, BLOOD: Lipase: 37 U/L (ref 11–51)

## 2020-10-21 LAB — COMPREHENSIVE METABOLIC PANEL
ALT: 33 U/L (ref 0–44)
AST: 22 U/L (ref 15–41)
Albumin: 4 g/dL (ref 3.5–5.0)
Alkaline Phosphatase: 66 U/L (ref 38–126)
Anion gap: 11 (ref 5–15)
BUN: 18 mg/dL (ref 6–20)
CO2: 24 mmol/L (ref 22–32)
Calcium: 9.3 mg/dL (ref 8.9–10.3)
Chloride: 101 mmol/L (ref 98–111)
Creatinine, Ser: 1.59 mg/dL — ABNORMAL HIGH (ref 0.61–1.24)
GFR, Estimated: 51 mL/min — ABNORMAL LOW (ref >60)
Glucose, Bld: 139 mg/dL — ABNORMAL HIGH (ref 70–99)
Potassium: 3.8 mmol/L (ref 3.5–5.1)
Sodium: 136 mmol/L (ref 135–145)
Total Bilirubin: 0.9 mg/dL (ref 0.3–1.2)
Total Protein: 7.4 g/dL (ref 6.5–8.1)

## 2020-10-21 LAB — CBC WITH DIFFERENTIAL/PLATELET
Abs Immature Granulocytes: 0.07 10*3/uL (ref 0.00–0.07)
Basophils Absolute: 0 10*3/uL (ref 0.0–0.1)
Basophils Relative: 0 %
Eosinophils Absolute: 0 10*3/uL (ref 0.0–0.5)
Eosinophils Relative: 0 %
HCT: 43.9 % (ref 39.0–52.0)
Hemoglobin: 15.1 g/dL (ref 13.0–17.0)
Immature Granulocytes: 0 %
Lymphocytes Relative: 9 %
Lymphs Abs: 1.4 10*3/uL (ref 0.7–4.0)
MCH: 29.5 pg (ref 26.0–34.0)
MCHC: 34.4 g/dL (ref 30.0–36.0)
MCV: 85.7 fL (ref 80.0–100.0)
Monocytes Absolute: 1.2 10*3/uL — ABNORMAL HIGH (ref 0.1–1.0)
Monocytes Relative: 8 %
Neutro Abs: 13.4 10*3/uL — ABNORMAL HIGH (ref 1.7–7.7)
Neutrophils Relative %: 83 %
Platelets: 286 10*3/uL (ref 150–400)
RBC: 5.12 MIL/uL (ref 4.22–5.81)
RDW: 13.2 % (ref 11.5–15.5)
WBC: 16.2 10*3/uL — ABNORMAL HIGH (ref 4.0–10.5)
nRBC: 0 % (ref 0.0–0.2)

## 2020-10-21 NOTE — ED Triage Notes (Signed)
C/O abdominal pain, denies PMH.

## 2020-10-21 NOTE — ED Notes (Signed)
PT decided to leave . 

## 2020-10-21 NOTE — ED Provider Notes (Signed)
Emergency Medicine Provider Triage Evaluation Note  Todd Kennedy , a 55 y.o. male  was evaluated in triage.  Pt complains of abdominal pain, mostly on left side x1 day. Last BM 2 days ago. No nausea, vomiting, or diarrhea. Taking Norco due to nose surgery. Denies fever. No previous abdominal operations.   Review of Systems  Positive: Abdominal pain Negative: N, V, D  Physical Exam  BP (!) 158/123 (BP Location: Right Arm)   Pulse 68   Temp 98.6 F (37 C) (Oral)   Resp 19   Ht 5\' 5"  (1.651 m)   Wt 68 kg   SpO2 100%   BMI 24.96 kg/m  Gen:   Awake, no distress   Resp:  Normal effort  MSK:   Moves extremities without difficulty  Other:  TTP throughout abdomen with voluntary guarding.  Medical Decision Making  Medically screening exam initiated at 3:11 PM.  Appropriate orders placed.  Todd Kennedy was informed that the remainder of the evaluation will be completed by another provider, this initial triage assessment does not replace that evaluation, and the importance of remaining in the ED until their evaluation is complete.  Abdominal labs.   Todd Collard, PA-C 10/21/20 1512    10/23/20, MD 10/31/20 [phone]
